# Patient Record
Sex: Female | Born: 1990 | Race: White | Hispanic: No | Smoking: Never smoker
Health system: Southern US, Community
[De-identification: ages and names within clinical notes are randomized; demographics above are authoritative.]

## PROBLEM LIST (undated history)

## (undated) DIAGNOSIS — R300 Dysuria: Secondary | ICD-10-CM

## (undated) DIAGNOSIS — K219 Gastro-esophageal reflux disease without esophagitis: Secondary | ICD-10-CM

## (undated) DIAGNOSIS — E538 Deficiency of other specified B group vitamins: Secondary | ICD-10-CM

## (undated) DIAGNOSIS — R3129 Other microscopic hematuria: Secondary | ICD-10-CM

## (undated) DIAGNOSIS — Z309 Encounter for contraceptive management, unspecified: Secondary | ICD-10-CM

## (undated) DIAGNOSIS — K311 Adult hypertrophic pyloric stenosis: Secondary | ICD-10-CM

## (undated) DIAGNOSIS — B029 Zoster without complications: Secondary | ICD-10-CM

## (undated) DIAGNOSIS — R7303 Prediabetes: Secondary | ICD-10-CM

## (undated) DIAGNOSIS — F418 Other specified anxiety disorders: Secondary | ICD-10-CM

## (undated) DIAGNOSIS — Z79899 Other long term (current) drug therapy: Secondary | ICD-10-CM

## (undated) DIAGNOSIS — E669 Obesity, unspecified: Secondary | ICD-10-CM

## (undated) DIAGNOSIS — R000 Tachycardia, unspecified: Secondary | ICD-10-CM

## (undated) DIAGNOSIS — J309 Allergic rhinitis, unspecified: Secondary | ICD-10-CM

## (undated) DIAGNOSIS — K5900 Constipation, unspecified: Secondary | ICD-10-CM

## (undated) DIAGNOSIS — F603 Borderline personality disorder: Secondary | ICD-10-CM

## (undated) DIAGNOSIS — M25561 Pain in right knee: Secondary | ICD-10-CM

## (undated) DIAGNOSIS — K589 Irritable bowel syndrome without diarrhea: Secondary | ICD-10-CM

## (undated) DIAGNOSIS — N390 Urinary tract infection, site not specified: Secondary | ICD-10-CM

## (undated) DIAGNOSIS — K2970 Gastritis, unspecified, without bleeding: Secondary | ICD-10-CM

## (undated) DIAGNOSIS — E039 Hypothyroidism, unspecified: Secondary | ICD-10-CM

## (undated) DIAGNOSIS — J45909 Unspecified asthma, uncomplicated: Secondary | ICD-10-CM

## (undated) DIAGNOSIS — R05 Cough: Secondary | ICD-10-CM

## (undated) DIAGNOSIS — F319 Bipolar disorder, unspecified: Secondary | ICD-10-CM

## (undated) DIAGNOSIS — R053 Chronic cough: Secondary | ICD-10-CM

## (undated) DIAGNOSIS — T148XXA Other injury of unspecified body region, initial encounter: Secondary | ICD-10-CM

## (undated) HISTORY — DX: Borderline personality disorder: F60.3

## (undated) HISTORY — DX: Allergic rhinitis, unspecified: J30.9

## (undated) HISTORY — DX: Other microscopic hematuria: R31.29

## (undated) HISTORY — DX: Constipation, unspecified: K59.00

## (undated) HISTORY — DX: Dysuria: R30.0

## (undated) HISTORY — DX: Chronic cough: R05.3

## (undated) HISTORY — DX: Other specified anxiety disorders: F41.8

## (undated) HISTORY — DX: Hypothyroidism, unspecified: E03.9

## (undated) HISTORY — DX: Urinary tract infection, site not specified: N39.0

## (undated) HISTORY — DX: Cough: R05

## (undated) HISTORY — DX: Pain in right knee: M25.561

## (undated) HISTORY — DX: Obesity, unspecified: E66.9

## (undated) HISTORY — DX: Irritable bowel syndrome without diarrhea: K58.9

## (undated) HISTORY — DX: Other long term (current) drug therapy: Z79.899

## (undated) HISTORY — DX: Deficiency of other specified B group vitamins: E53.8

## (undated) HISTORY — PX: OTHER SURGICAL HISTORY: SHX169

## (undated) HISTORY — DX: Gastro-esophageal reflux disease without esophagitis: K21.9

## (undated) HISTORY — DX: Zoster without complications: B02.9

## (undated) HISTORY — DX: Encounter for contraceptive management, unspecified: Z30.9

## (undated) HISTORY — DX: Tachycardia, unspecified: R00.0

## (undated) HISTORY — DX: Prediabetes: R73.03

## (undated) HISTORY — DX: Adult hypertrophic pyloric stenosis: K31.1

## (undated) HISTORY — DX: Gastritis, unspecified, without bleeding: K29.70

---

## 2009-06-11 HISTORY — PX: CHOLECYSTECTOMY: SHX55

## 2009-06-11 HISTORY — PX: LIVER BIOPSY: SHX301

## 2009-11-14 ENCOUNTER — Emergency Department: Payer: Self-pay | Admitting: Emergency Medicine

## 2009-12-23 ENCOUNTER — Inpatient Hospital Stay: Payer: Self-pay | Admitting: Psychiatry

## 2010-01-01 ENCOUNTER — Inpatient Hospital Stay: Payer: Self-pay | Admitting: Psychiatry

## 2010-01-08 ENCOUNTER — Emergency Department: Payer: Self-pay | Admitting: Emergency Medicine

## 2010-02-24 ENCOUNTER — Emergency Department: Payer: Self-pay | Admitting: Emergency Medicine

## 2011-07-05 ENCOUNTER — Emergency Department: Payer: Self-pay | Admitting: Emergency Medicine

## 2011-07-16 ENCOUNTER — Emergency Department: Payer: Self-pay | Admitting: Emergency Medicine

## 2012-06-19 ENCOUNTER — Emergency Department (HOSPITAL_COMMUNITY)
Admission: EM | Admit: 2012-06-19 | Discharge: 2012-06-19 | Disposition: A | Discharge status: Home / Self Care | Payer: Medicaid Other | Attending: Emergency Medicine | Admitting: Emergency Medicine

## 2012-06-19 ENCOUNTER — Encounter (HOSPITAL_COMMUNITY): Payer: Self-pay | Admitting: Emergency Medicine

## 2012-06-19 ENCOUNTER — Emergency Department (HOSPITAL_COMMUNITY): Payer: Medicaid Other

## 2012-06-19 DIAGNOSIS — F319 Bipolar disorder, unspecified: Secondary | ICD-10-CM | POA: Insufficient documentation

## 2012-06-19 DIAGNOSIS — Z9089 Acquired absence of other organs: Secondary | ICD-10-CM | POA: Insufficient documentation

## 2012-06-19 DIAGNOSIS — E079 Disorder of thyroid, unspecified: Secondary | ICD-10-CM | POA: Insufficient documentation

## 2012-06-19 DIAGNOSIS — Z711 Person with feared health complaint in whom no diagnosis is made: Secondary | ICD-10-CM

## 2012-06-19 DIAGNOSIS — R5381 Other malaise: Secondary | ICD-10-CM | POA: Insufficient documentation

## 2012-06-19 HISTORY — DX: Gastro-esophageal reflux disease without esophagitis: K21.9

## 2012-06-19 HISTORY — DX: Bipolar disorder, unspecified: F31.9

## 2012-06-19 LAB — URINE MICROSCOPIC-ADD ON

## 2012-06-19 LAB — CBC WITH DIFFERENTIAL/PLATELET
Eosinophils Relative: 2 % (ref 0–5)
HCT: 39.1 % (ref 36.0–46.0)
Hemoglobin: 13 g/dL (ref 12.0–15.0)
Lymphocytes Relative: 28 % (ref 12–46)
MCHC: 33.2 g/dL (ref 30.0–36.0)
MCV: 89.9 fL (ref 78.0–100.0)
Monocytes Absolute: 0.4 10*3/uL (ref 0.1–1.0)
Monocytes Relative: 5 % (ref 3–12)
Neutro Abs: 5.4 10*3/uL (ref 1.7–7.7)
WBC: 8.5 10*3/uL (ref 4.0–10.5)

## 2012-06-19 LAB — BASIC METABOLIC PANEL
BUN: 6 mg/dL (ref 6–23)
CO2: 27 mEq/L (ref 19–32)
Chloride: 102 mEq/L (ref 96–112)
Creatinine, Ser: 0.79 mg/dL (ref 0.50–1.10)

## 2012-06-19 LAB — URINALYSIS, ROUTINE W REFLEX MICROSCOPIC
Bilirubin Urine: NEGATIVE
Glucose, UA: NEGATIVE mg/dL
Hgb urine dipstick: NEGATIVE
Ketones, ur: NEGATIVE mg/dL
pH: 7.5 (ref 5.0–8.0)

## 2012-06-19 MED ORDER — PROMETHAZINE HCL 12.5 MG PO TABS
25.0000 mg | ORAL_TABLET | ORAL | Status: AC
  Administered 2012-06-19: 25 mg via ORAL
  Filled 2012-06-19: qty 2

## 2012-06-19 MED ORDER — IBUPROFEN 800 MG PO TABS
800.0000 mg | ORAL_TABLET | Freq: Once | ORAL | Status: AC
  Administered 2012-06-19: 800 mg via ORAL
  Filled 2012-06-19: qty 1

## 2012-06-19 MED ORDER — PROMETHAZINE HCL 25 MG PO TABS: 25.0000 mg | ORAL_TABLET | Freq: Four times a day (QID) | ORAL | Status: DC | PRN

## 2012-06-19 NOTE — ED Provider Notes (Signed)
History     CSN: [id number]  Arrival date & time 06/19/12  7425   First MD Initiated Contact with Patient 06/19/12 1011      Chief Complaint  Patient presents with  . Fatigue    (Consider location/radiation/quality/duration/timing/severity/associated sxs/prior treatment) HPI Comments: Feels fatigued, NP cough, urinary frequency and "itching" with urination and myalgias x 3 days.  LNMP ended 4 days ago.  No vaginal d/c.  No n/v/d.  No fever or chills.  No SOB.  The history is provided by the patient. No language interpreter was used.    Past Medical History  Diagnosis Date  . Personality disorder   . Bipolar 1 disorder   . Acid reflux   . Thyroid disease     Past Surgical History  Procedure Date  . Cholecystectomy     History reviewed. No pertinent family history.  History  Substance Use Topics  . Smoking status: Not on file  . Smokeless tobacco: Not on file  . Alcohol Use: No    OB History    Grav Para Term Preterm Abortions TAB SAB Ect Mult Living                  Review of Systems  Constitutional: Positive for fatigue. Negative for fever and chills.  Respiratory: Positive for cough. Negative for shortness of breath and wheezing.   Gastrointestinal: Negative for nausea, vomiting and diarrhea.  Genitourinary: Positive for dysuria and frequency. Negative for urgency, hematuria, vaginal bleeding and vaginal discharge.  Musculoskeletal: Positive for myalgias.  All other systems reviewed and are negative.    Allergies  Penicillins  Home Medications   Current Outpatient Rx  Name Route Sig Dispense Refill  . CLONAZEPAM 0.5 MG PO TABS Oral Take 0.5-1 mg by mouth 2 (two) times daily. Takes 0.5 mg (1 tab) in the morning and 2 tabs at bedtime    . FLUOXETINE HCL 40 MG PO CAPS Oral Take 40 mg by mouth daily.    Marland Kitchen LEVOTHYROXINE SODIUM 25 MCG PO TABS Oral Take 25 mcg by mouth daily.    Marland Kitchen LITHIUM CARBONATE 300 MG PO CAPS Oral Take 300-600 mg by mouth 2 (two)  times daily. Takes 1 capsule (300 mg) in the morning and takes 2 capsules (600mg ) at bedtime    . OMEPRAZOLE 20 MG PO CPDR Oral Take 20 mg by mouth daily.    Marland Kitchen HYDROCODONE-IBUPROFEN 7.5-200 MG PO TABS Oral Take 1 tablet by mouth every 8 (eight) hours as needed. pain    . PROMETHAZINE HCL 25 MG PO TABS Oral Take 25 mg by mouth every 6 (six) hours as needed. nausea    . PROMETHAZINE HCL 25 MG PO TABS Oral Take 1 tablet (25 mg total) by mouth every 6 (six) hours as needed for nausea. 30 tablet 0    BP 117/80  Pulse 85  Temp 98.2 F (36.8 C) (Oral)  Resp 18  Ht 5\' 6"  (1.676 m)  Wt 230 lb (104.327 kg)  BMI 37.12 kg/m2  SpO2 99%  LMP 06/15/2012  Physical Exam  Nursing note and vitals reviewed. Constitutional: She is oriented to person, place, and time. She appears well-developed and well-nourished. No distress.  HENT:  Head: Normocephalic and atraumatic.  Eyes: EOM are normal.  Neck: Normal range of motion.  Cardiovascular: Normal rate, regular rhythm and normal heart sounds.   Pulmonary/Chest: Effort normal and breath sounds normal. No accessory muscle usage. Not tachypneic. No respiratory distress. She has no decreased breath sounds.  She has no wheezes. She has no rhonchi. She has no rales. She exhibits no tenderness.  Abdominal: Soft. Normal appearance. She exhibits no distension. There is no hepatosplenomegaly. There is tenderness in the suprapubic area. There is CVA tenderness. There is no rigidity, no rebound, no guarding, no tenderness at McBurney's point and negative Murphy's sign.  Genitourinary: No vaginal discharge found.  Musculoskeletal: Normal range of motion.  Neurological: She is alert and oriented to person, place, and time.  Skin: Skin is warm and dry. No rash noted.  Psychiatric: She has a normal mood and affect. Judgment normal.    ED Course  Procedures (including critical care time)  Labs Reviewed  URINALYSIS, ROUTINE W REFLEX MICROSCOPIC - Abnormal; Notable for  the following:    Leukocytes, UA TRACE (*)     All other components within normal limits  URINE MICROSCOPIC-ADD ON - Abnormal; Notable for the following:    Squamous Epithelial / LPF MANY (*)     Bacteria, UA FEW (*)     All other components within normal limits  CBC WITH DIFFERENTIAL  BASIC METABOLIC PANEL  PREGNANCY, URINE   Dg Chest 2 View  06/19/2012  *RADIOLOGY REPORT*  Clinical Data: 21 year old female with cough, congestion and weakness.  CHEST - 2 VIEW  Comparison: None  Findings: The cardiomediastinal silhouette is unremarkable. The lungs are clear. There is no evidence of focal airspace disease, pulmonary edema, suspicious pulmonary nodule/mass, pleural effusion, or pneumothorax. No acute bony abnormalities are identified.  IMPRESSION: No evidence of active cardiopulmonary disease.   Original Report Authenticated By: Rosendo Gros, M.D.    At discharge time pt states she needs something for nausea.  Wrote rx for phenergan tabs.  She also does not appear to be happy when told she can take tylenol or ibuprofen for myalgias although did not specify what she would like.  1. Worried well       MDM  APAP/ibuprofen for myalgias.  rx-phenergan tabs, 12 F/u with PCP prn        Evalina Field, PA 06/19/12 1150

## 2012-06-19 NOTE — ED Notes (Signed)
Pt c/o weakness and pain all over x 3 days.

## 2012-06-19 NOTE — ED Provider Notes (Signed)
Medical screening examination/treatment/procedure(s) were performed by non-physician practitioner and as supervising physician I was immediately available for consultation/collaboration.  Raeford Razor, MD 06/19/12 1500

## 2012-06-21 ENCOUNTER — Emergency Department (HOSPITAL_COMMUNITY): Payer: Medicaid Other

## 2012-06-21 ENCOUNTER — Encounter (HOSPITAL_COMMUNITY): Payer: Self-pay | Admitting: Emergency Medicine

## 2012-06-21 ENCOUNTER — Emergency Department (HOSPITAL_COMMUNITY)
Admission: EM | Admit: 2012-06-21 | Discharge: 2012-06-21 | Disposition: A | Discharge status: Home / Self Care | Payer: Medicaid Other | Attending: Emergency Medicine | Admitting: Emergency Medicine

## 2012-06-21 DIAGNOSIS — R109 Unspecified abdominal pain: Secondary | ICD-10-CM | POA: Insufficient documentation

## 2012-06-21 DIAGNOSIS — E079 Disorder of thyroid, unspecified: Secondary | ICD-10-CM | POA: Insufficient documentation

## 2012-06-21 DIAGNOSIS — Z9089 Acquired absence of other organs: Secondary | ICD-10-CM | POA: Insufficient documentation

## 2012-06-21 DIAGNOSIS — F319 Bipolar disorder, unspecified: Secondary | ICD-10-CM | POA: Insufficient documentation

## 2012-06-21 DIAGNOSIS — R1110 Vomiting, unspecified: Secondary | ICD-10-CM | POA: Insufficient documentation

## 2012-06-21 LAB — COMPREHENSIVE METABOLIC PANEL
AST: 25 U/L (ref 0–37)
Albumin: 3.3 g/dL — ABNORMAL LOW (ref 3.5–5.2)
Calcium: 9.9 mg/dL (ref 8.4–10.5)
Chloride: 103 mEq/L (ref 96–112)
Creatinine, Ser: 0.93 mg/dL (ref 0.50–1.10)
Total Protein: 7.4 g/dL (ref 6.0–8.3)

## 2012-06-21 LAB — URINALYSIS, ROUTINE W REFLEX MICROSCOPIC
Hgb urine dipstick: NEGATIVE
Protein, ur: NEGATIVE mg/dL
Urobilinogen, UA: 0.2 mg/dL (ref 0.0–1.0)

## 2012-06-21 LAB — CBC WITH DIFFERENTIAL/PLATELET
Basophils Absolute: 0 10*3/uL (ref 0.0–0.1)
Basophils Relative: 0 % (ref 0–1)
Eosinophils Relative: 2 % (ref 0–5)
HCT: 41.5 % (ref 36.0–46.0)
MCHC: 33 g/dL (ref 30.0–36.0)
Monocytes Absolute: 0.7 10*3/uL (ref 0.1–1.0)
Neutro Abs: 6.6 10*3/uL (ref 1.7–7.7)
RDW: 12.8 % (ref 11.5–15.5)

## 2012-06-21 LAB — PREGNANCY, URINE: Preg Test, Ur: NEGATIVE

## 2012-06-21 MED ORDER — SODIUM CHLORIDE 0.9 % IV BOLUS (SEPSIS)
1000.0000 mL | Freq: Once | INTRAVENOUS | Status: AC
  Administered 2012-06-21: 1000 mL via INTRAVENOUS

## 2012-06-21 MED ORDER — IOHEXOL 300 MG/ML  SOLN
100.0000 mL | Freq: Once | INTRAMUSCULAR | Status: AC | PRN
  Administered 2012-06-21: 100 mL via INTRAVENOUS

## 2012-06-21 MED ORDER — PROMETHAZINE HCL 25 MG PO TABS: 25.0000 mg | ORAL_TABLET | Freq: Four times a day (QID) | ORAL | Status: DC | PRN

## 2012-06-21 MED ORDER — HYDROMORPHONE HCL PF 1 MG/ML IJ SOLN
1.0000 mg | Freq: Once | INTRAMUSCULAR | Status: AC
  Administered 2012-06-21: 1 mg via INTRAVENOUS
  Filled 2012-06-21: qty 1

## 2012-06-21 MED ORDER — OXYCODONE-ACETAMINOPHEN 5-325 MG PO TABS: 1.0000 | ORAL_TABLET | Freq: Four times a day (QID) | ORAL | Status: AC | PRN

## 2012-06-21 MED ORDER — ONDANSETRON HCL 4 MG/2ML IJ SOLN
4.0000 mg | Freq: Once | INTRAMUSCULAR | Status: AC
  Administered 2012-06-21: 4 mg via INTRAVENOUS
  Filled 2012-06-21: qty 2

## 2012-06-21 MED ORDER — HYDROMORPHONE HCL PF 1 MG/ML IJ SOLN
INTRAMUSCULAR | Status: AC
  Administered 2012-06-21: 1 mg
  Filled 2012-06-21: qty 1

## 2012-06-21 MED ORDER — OXYCODONE-ACETAMINOPHEN 5-325 MG PO TABS: 1.0000 | ORAL_TABLET | Freq: Four times a day (QID) | ORAL | Status: DC | PRN

## 2012-06-21 NOTE — ED Notes (Signed)
Patient with c/o diffuse abdominal pain with nausea x 5 days. Worse after eating per patient.

## 2012-06-21 NOTE — ED Provider Notes (Signed)
History  This chart was scribed for Tracy Lennert, MD by Erskine Emery. This patient was seen in room APA09/[facility]09 and the patient's care was started at 18:48.   CSN: [id number]  Arrival date & time 06/21/12  1537   First MD Initiated Contact with Patient 06/21/12 1848      Chief Complaint  Patient presents with  . Abdominal Pain  . Emesis    (Consider location/radiation/quality/duration/timing/severity/associated sxs/prior Treatment) Tracy Petersen is a 21 y.o. female who presents to the Emergency Department complaining of gradually worsening sharp, stabbing abdominal pain, worse on right side for the past 4 days. Pt reports the pain is unlike anything she has ever experienced before and is aggravated by eating. Pt reports she has had a lot of CT scans for various complaints but they always come out negative.  Pt saw a stomach specialist for a cholecystectomy over 2 years ago. Patient is a 21 y.o. female presenting with abdominal pain and vomiting. The history is provided by the patient. No language interpreter was used.  Abdominal Pain The primary symptoms of the illness include abdominal pain, nausea and vomiting. The primary symptoms of the illness do not include fever, fatigue or diarrhea. The current episode started more than 2 days ago. The onset of the illness was gradual. The problem has been gradually worsening.  The illness is associated with eating. The patient states that she believes she is currently not pregnant. The patient has not had a change in bowel habit. Symptoms associated with the illness do not include hematuria, frequency or back pain. Associated symptoms comments: nausea. Significant associated medical issues include GERD.  Emesis  Associated symptoms include abdominal pain. Pertinent negatives include no cough, no diarrhea, no fever and no headaches.    Past Medical History  Diagnosis Date  . Personality disorder   . Bipolar 1 disorder   . Acid reflux   .  Thyroid disease     Past Surgical History  Procedure Date  . Cholecystectomy     No family history on file.  History  Substance Use Topics  . Smoking status: Not on file  . Smokeless tobacco: Not on file  . Alcohol Use: No    OB History    Grav Para Term Preterm Abortions TAB SAB Ect Mult Living                  Review of Systems  Constitutional: Negative for fever and fatigue.  HENT: Negative for congestion, sinus pressure and ear discharge.   Eyes: Negative for discharge.  Respiratory: Negative for cough.   Cardiovascular: Negative for chest pain.  Gastrointestinal: Positive for nausea and abdominal pain. Negative for diarrhea.  Genitourinary: Negative for frequency and hematuria.  Musculoskeletal: Negative for back pain.  Skin: Negative for rash.  Neurological: Negative for seizures and headaches.  Hematological: Negative.   Psychiatric/Behavioral: Negative for hallucinations.  All other systems reviewed and are negative.    Allergies  Penicillins  Home Medications   Current Outpatient Rx  Name Route Sig Dispense Refill  . CLONAZEPAM 0.5 MG PO TABS Oral Take 0.5-1 mg by mouth 2 (two) times daily. Takes 0.5 mg (1 tab) in the morning and 2 tabs at bedtime    . FLUOXETINE HCL 40 MG PO CAPS Oral Take 40 mg by mouth daily.    Marland Kitchen LEVOTHYROXINE SODIUM 25 MCG PO TABS Oral Take 25 mcg by mouth daily.    Marland Kitchen LITHIUM CARBONATE 300 MG PO CAPS Oral Take 300-600  mg by mouth 2 (two) times daily. Takes 1 capsule (300 mg) in the morning and takes 2 capsules (600mg ) at bedtime    . OMEPRAZOLE 20 MG PO CPDR Oral Take 20 mg by mouth daily.    Marland Kitchen PROMETHAZINE HCL 25 MG PO TABS Oral Take 25 mg by mouth every 6 (six) hours as needed. nausea    . HYDROCODONE-IBUPROFEN 7.5-200 MG PO TABS Oral Take 1 tablet by mouth every 8 (eight) hours as needed. pain      Triage Vitals: BP 116/66  Pulse 117  Temp 98.7 F (37.1 C) (Oral)  Resp 20  Ht 5\' 6"  (1.676 m)  Wt 230 lb (104.327 kg)  BMI  37.12 kg/m2  SpO2 99%  LMP 06/15/2012  Physical Exam  Nursing note and vitals reviewed. Constitutional: She is oriented to person, place, and time. She appears well-developed.  HENT:  Head: Normocephalic and atraumatic.  Eyes: Conjunctivae and EOM are normal. No scleral icterus.  Neck: Neck supple. No thyromegaly present.  Cardiovascular: Normal rate and regular rhythm.  Exam reveals no gallop and no friction rub.   No murmur heard. Pulmonary/Chest: No stridor. She has no wheezes. She has no rales. She exhibits no tenderness.  Abdominal: She exhibits no distension. There is tenderness. There is no rebound.       Moderate RUQ and RLQ tenderness.  Musculoskeletal: Normal range of motion. She exhibits no edema.  Lymphadenopathy:    She has no cervical adenopathy.  Neurological: She is oriented to person, place, and time. Coordination normal.  Skin: No rash noted. No erythema.  Psychiatric: She has a normal mood and affect. Her behavior is normal.    ED Course  Procedures (including critical care time) DIAGNOSTIC STUDIES: Oxygen Saturation is 99% on room air, normal by my interpretation.    COORDINATION OF CARE: 18:55-I evaluated the patient and we discussed a treatment plan including urinalysis, pain medication, and radiology to which the pt agreed.   22:12--I rechecked the pt and notified her that all her labs and imaging look okay. Pt reports she doesn't have a PCP because she just moved here a week ago. Pt reports she has had abdominal pain but never this severe. I told her I would discharge her home with medication and instructed her to follow up with a GI specialist.    Labs Reviewed  URINALYSIS, ROUTINE W REFLEX MICROSCOPIC  PREGNANCY, URINE   No results found.   No diagnosis found.    MDM        The chart was scribed for me under my direct supervision.  I personally performed the history, physical, and medical decision making and all procedures in the  evaluation of this patient.Tracy Lennert, MD 06/21/12 2218

## 2012-06-21 NOTE — ED Notes (Addendum)
Pt reports she is unable to afford medications and wanting to be discharged home with full prescriptions.  Explained to pt that we were unable to do that.  Pt angry. Provided pt with list of [facility] and encouraged follow up with them for assistance.

## 2012-06-30 ENCOUNTER — Encounter: Payer: Self-pay | Admitting: Gastroenterology

## 2012-06-30 ENCOUNTER — Ambulatory Visit (INDEPENDENT_AMBULATORY_CARE_PROVIDER_SITE_OTHER): Payer: Medicaid Other | Admitting: Gastroenterology

## 2012-06-30 VITALS — BP 109/79 | HR 97 | Temp 97.6°F | Ht 66.0 in | Wt 248.8 lb

## 2012-06-30 DIAGNOSIS — R109 Unspecified abdominal pain: Secondary | ICD-10-CM | POA: Insufficient documentation

## 2012-06-30 DIAGNOSIS — R198 Other specified symptoms and signs involving the digestive system and abdomen: Secondary | ICD-10-CM | POA: Insufficient documentation

## 2012-06-30 MED ORDER — DICYCLOMINE HCL 10 MG PO CAPS: 10.0000 mg | ORAL_CAPSULE | Freq: Four times a day (QID) | ORAL | Status: DC

## 2012-06-30 NOTE — Assessment & Plan Note (Addendum)
21 year old with hx of significant psychiatric issues living in a group home with husband, who reports chronic abdominal pain that is constant, worsened by eating. No rectal bleeding noted. No wt loss, lack of appetite. Cholecystectomy performed several years ago. Thus far CT and labs overall benign. Also notes alternating constipation and diarrhea, unrelated to abdominal pain. Physical exam quite interesting with pt grimacing and jumping prior to me even palpating her abdomen. +Carnett's sign. I question a strong component of functional abdominal pain/adhesive disease with possible underlying undiagnosed IBS. She has not had an upper or lower GI evaluation, although she states she has. There is no indication to proceed with colonoscopy or upper endoscopy at this point. We will try supportive measures for IBS first.   Bentyl Miralax High fiber diet Probiotic Return in 4 weeks Consider Pain Management referral. Possibly consider EGD if symptoms seem to point more towards dyspepsia at next visit. Pt does note worsened with eating, but her pain is constant and I doubt we are dealing with gastritis, PUD, etc.

## 2012-06-30 NOTE — Progress Notes (Signed)
Faxed to PCP

## 2012-06-30 NOTE — Patient Instructions (Addendum)
Follow a high fiber diet. Avoid fatty and greasy foods.   Start taking a probiotic daily. This can be over-the-counter such as Digestive Advantage, [employer]'s [employer] Health.   For constipation: You may take one dose of Miralax daily as needed. Stop if you have loose stools.  For loose stools: Bentyl 10 mg before meals and at bedtime (no more than 4 a day). Monitor for constipation, dry mouth.   We will see you back in 4 weeks to see how you are doing.    High Fiber Diet A high fiber diet changes your normal diet to include more whole grains, legumes, fruits, and vegetables. Changes in the diet involve replacing refined carbohydrates with unrefined foods. The calorie level of the diet is essentially unchanged. The Dietary Reference Intake (recommended amount) for adult males is 38 g per day. For adult females, it is 25 g per day. Pregnant and lactating women should consume 28 g of fiber per day. Fiber is the intact part of a plant that is not broken down during digestion. Functional fiber is fiber that has been isolated from the plant to provide a beneficial effect in the body. PURPOSE  Increase stool bulk.   Ease and regulate bowel movements.   Lower cholesterol.  INDICATIONS THAT YOU NEED MORE FIBER  Constipation and hemorrhoids.   Uncomplicated diverticulosis (intestine condition) and irritable bowel syndrome.   Weight management.   As a protective measure against hardening of the arteries (atherosclerosis), diabetes, and cancer.  NOTE OF CAUTION If you have a digestive or bowel problem, ask your caregiver for advice before adding high fiber foods to your diet. Some of the following medical problems are such that a high fiber diet should not be used without consulting your caregiver:  Acute diverticulitis (intestine infection).   Partial small bowel obstructions.   Complicated diverticular disease involving bleeding, rupture (perforation), or abscess (boil, furuncle).    Presence of autonomic neuropathy (nerve damage) or gastric paresis (stomach cannot empty itself).  GUIDELINES FOR INCREASING FIBER  Start adding fiber to the diet slowly. A gradual increase of about 5 more grams (2 slices of whole-wheat bread, 2 servings of most fruits or vegetables, or 1 bowl of high fiber cereal) per day is best. Too rapid an increase in fiber may result in constipation, flatulence, and bloating.   Drink enough water and fluids to keep your urine clear or pale yellow. Water, juice, or caffeine-free drinks are recommended. Not drinking enough fluid may cause constipation.   Eat a variety of high fiber foods rather than one type of fiber.   Try to increase your intake of fiber through using high fiber foods rather than fiber pills or supplements that contain small amounts of fiber.   The goal is to change the types of food eaten. Do not supplement your present diet with high fiber foods, but replace foods in your present diet.  INCLUDE A VARIETY OF FIBER SOURCES  Replace refined and processed grains with whole grains, canned fruits with fresh fruits, and incorporate other fiber sources. White rice, white breads, and most bakery goods contain little or no fiber.   Brown whole-grain rice, buckwheat oats, and many fruits and vegetables are all good sources of fiber. These include: broccoli, Brussels sprouts, cabbage, cauliflower, beets, sweet potatoes, white potatoes (skin on), carrots, tomatoes, eggplant, squash, berries, fresh fruits, and dried fruits.   Cereals appear to be the richest source of fiber. Cereal fiber is found in whole grains and bran. Bran is  the fiber-rich outer coat of cereal grain, which is largely removed in refining. In whole-grain cereals, the bran remains. In breakfast cereals, the largest amount of fiber is found in those with "bran" in their names. The fiber content is sometimes indicated on the label.   You may need to include additional fruits and  vegetables each day.   In baking, for 1 cup white flour, you may use the following substitutions:   1 cup whole-wheat flour minus 2 tbs.    cup white flour plus  cup whole-wheat flour.  Document Released: 10/12/2005 Document Revised: 10/01/2011 Document Reviewed: 08/20/2009 [facility] Patient Information 2012 [place], [place].

## 2012-06-30 NOTE — Assessment & Plan Note (Signed)
See Abdominal pain

## 2012-06-30 NOTE — Progress Notes (Signed)
Primary Care Physician:  Colon Branch, MD Primary Gastroenterologist:  Dr. Darrick Penna   Chief Complaint  Patient presents with  . Abdominal Pain  . Nausea  . Constipation  . Diarrhea  . Obesity    HPI:   21 year old female who lives in a group home with her husband, whom she met in a previous group home, presents today with abdominal pain. Several visits to ED noted. Normal CBC, no LFT abnormality, normal lipase, CT overall benign. New resident at [facility] since Aug 19th. Used to live in [place]. Notes right-sided abdominal pain, diffuse abdominal pain, +nausea, headaches that feel like getting stabbed. +fatigue. Notes right upper back pain as well. Onset of pain for several weeks, described as sharp/stabbing/crampy. Always there. Eating/drinking makes pain worse, regardless of what it is. Pain medicine only thing that helps to alleviate. No back injuries. Alternating between constipation and diarrhea. Notes now dealing with constipation. Will have postprandial loose stools for 2 days, then starts having constipation for 2-3 days. Loose stools usually total twice per day.  Has experienced episodes similar, but this episode here is the worst. Before in [place], was married and staying in [place], saw a GI doctor there. [facility] in [place]. Dr. Herschel [place]. Was at least 2 years ago. Underwent cholecystectomy. Was told "something was out of place". Was told would be experiencing pain. Has happened more than 5 times. Pt states she had an upper endoscopy and colonoscopy; however, no record of this exists at that facility. No rectal bleeding, melena. No routine NSAIDs.     CT abd/pelvis Aug 2013 : Findings: Limited images through the lung bases demonstrate no  significant appreciable abnormality. The heart size is within  normal limits. No pleural or pericardial effusion.  Hepatic steatosis. Cholecystectomy. No biliary ductal dilatation.  Unremarkable spleen, pancreas, adrenal  glands.  Symmetric renal enhancement. No hydronephrosis or hydroureter.  No bowel obstruction. No CT evidence for colitis. Normal  appendix. No free intraperitoneal air or fluid. No  lymphadenopathy.  Normal caliber vasculature. Circumaortic left renal vein.  Circumferential bladder wall thickening is nonspecific given  incomplete distension. Uterus and adnexa within normal limits for  CT. No free fluid within the pelvis. No pelvic adenopathy.  No acute osseous finding. Small fat containing supraumbilical  hernia.  IMPRESSION:  Hepatic steatosis.  No acute intra-abdominal process identified.  Past Medical History  Diagnosis Date  . Personality disorder   . Bipolar 1 disorder   . Acid reflux   . Hypothyroidism   . Depression with anxiety     Past Surgical History  Procedure Date  . Cholecystectomy   . Surgery as a baby     unsure what it was    Current Outpatient Prescriptions  Medication Sig Dispense Refill  . clonazePAM (KLONOPIN) 0.5 MG tablet Take 0.5-1 mg by mouth 2 (two) times daily. Takes 0.5 mg (1 tab) in the morning and 2 tabs at bedtime      . FLUoxetine (PROZAC) 40 MG capsule Take 40 mg by mouth daily.      Marland Kitchen HYDROcodone-ibuprofen (VICOPROFEN) 7.5-200 MG per tablet Take 1 tablet by mouth every 8 (eight) hours as needed. pain      . levothyroxine (SYNTHROID, LEVOTHROID) 25 MCG tablet Take 25 mcg by mouth daily.      Marland Kitchen lithium carbonate 300 MG capsule Take 300-600 mg by mouth 2 (two) times daily. Takes 1 capsule (300 mg) in the morning and takes 2 capsules (600mg ) at bedtime      .  omeprazole (PRILOSEC) 20 MG capsule Take 20 mg by mouth daily.      . promethazine (PHENERGAN) 25 MG tablet Take 25 mg by mouth every 6 (six) hours as needed. nausea      . dicyclomine (BENTYL) 10 MG capsule Take 1 capsule (10 mg total) by mouth 4 (four) times daily.  120 capsule  1  . oxyCODONE-acetaminophen (PERCOCET/ROXICET) 5-325 MG per tablet Take 1 tablet by mouth every 6 (six) hours  as needed for pain.  30 tablet  0  . promethazine (PHENERGAN) 25 MG tablet Take 1 tablet (25 mg total) by mouth every 6 (six) hours as needed for nausea.  15 tablet  0    Allergies as of 06/30/2012 - Review Complete 06/30/2012  Allergen Reaction Noted  . Penicillins  06/19/2012    Family History  Problem Relation Age of Onset  . Colon cancer Neg Hx     History   Social History  . Marital Status: Married    Spouse Name: N/A    Number of Children: N/A  . Years of Education: N/A   Occupational History  . Not on file.   Social History Main Topics  . Smoking status: Never Smoker   . Smokeless tobacco: Not on file  . Alcohol Use: Yes  . Drug Use: No  . Sexually Active: Not on file   Other Topics Concern  . Not on file   Social History Narrative  . No narrative on file    Review of Systems: Gen: Denies any fever, chills, fatigue, weight loss, lack of appetite.  CV: Denies chest pain, heart palpitations, peripheral edema, syncope.  Resp: Denies shortness of breath at rest or with exertion. Denies wheezing or cough.  GI: Denies dysphagia or odynophagia. Denies jaundice, hematemesis, fecal incontinence. GU : Denies urinary burning, urinary frequency, urinary hesitancy MS: Denies joint pain, muscle weakness, cramps, or limitation of movement.  Derm: Denies rash, itching, dry skin Psych: + depression/anxiety Heme: Denies bruising, bleeding, and enlarged lymph nodes.  Physical Exam: BP 109/79  Pulse 97  Temp 97.6 F (36.4 C) (Temporal)  Ht 5\' 6"  (1.676 m)  Wt 248 lb 12.8 oz (112.855 kg)  BMI 40.16 kg/m2  LMP 06/15/2012 General:   Alert and oriented. Well-nourished and well-developed. Flat affect. Slow to respond.  Head:  Normocephalic and atraumatic. Eyes:  Without icterus, sclera clear and conjunctiva pink.  Ears:  Normal auditory acuity. Nose:  No deformity, discharge,  or lesions. Mouth:  No deformity or lesions, oral mucosa pink.  Neck:  Supple, without mass or  thyromegaly. Lungs:  Clear to auscultation bilaterally. No wheezes, rales, or rhonchi. No distress.  Heart:  S1, S2 present without murmurs appreciated.  Abdomen: Diffusely tender to palpation, out of proportion compared to palpation. Barely touching abdomen causes her to grimace, flinch.  +BS, soft, No HSM noted. Midline scar noted. +CARNETT'S SIGN Rectal:  Deferred  Msk:  Symmetrical without gross deformities. Normal posture. Extremities:  Without clubbing or edema. Neurologic:  Alert and  oriented x4;  grossly normal neurologically. Skin:  Intact without significant lesions or rashes. Cervical Nodes:  No significant cervical adenopathy. Psych:  Alert and cooperative. Flat affect.

## 2012-07-24 ENCOUNTER — Encounter (HOSPITAL_COMMUNITY): Payer: Self-pay | Admitting: *Deleted

## 2012-07-24 ENCOUNTER — Emergency Department (HOSPITAL_COMMUNITY)
Admission: EM | Admit: 2012-07-24 | Discharge: 2012-07-24 | Disposition: A | Discharge status: Home / Self Care | Payer: Medicaid Other | Attending: Emergency Medicine | Admitting: Emergency Medicine

## 2012-07-24 DIAGNOSIS — Z7289 Other problems related to lifestyle: Secondary | ICD-10-CM

## 2012-07-24 DIAGNOSIS — Z880 Allergy status to penicillin: Secondary | ICD-10-CM | POA: Insufficient documentation

## 2012-07-24 DIAGNOSIS — F489 Nonpsychotic mental disorder, unspecified: Secondary | ICD-10-CM | POA: Insufficient documentation

## 2012-07-24 DIAGNOSIS — F319 Bipolar disorder, unspecified: Secondary | ICD-10-CM | POA: Insufficient documentation

## 2012-07-24 DIAGNOSIS — E039 Hypothyroidism, unspecified: Secondary | ICD-10-CM | POA: Insufficient documentation

## 2012-07-24 DIAGNOSIS — F609 Personality disorder, unspecified: Secondary | ICD-10-CM | POA: Insufficient documentation

## 2012-07-24 DIAGNOSIS — K219 Gastro-esophageal reflux disease without esophagitis: Secondary | ICD-10-CM | POA: Insufficient documentation

## 2012-07-24 DIAGNOSIS — Z9089 Acquired absence of other organs: Secondary | ICD-10-CM | POA: Insufficient documentation

## 2012-07-24 NOTE — ED Notes (Signed)
[facility] notified of pts upcoming dishcarge. They are to send someone to pick pt up.

## 2012-07-24 NOTE — ED Notes (Signed)
Pt wanded by security. 

## 2012-07-24 NOTE — ED Notes (Signed)
MD at bedside speaking with patient. Pt tearful because she had to remove piercing's.

## 2012-07-24 NOTE — ED Notes (Signed)
Pt is from [facility]. Pt states she was cutting herself with a razor "a day or so ago"  Minor Abrasions to left upper arm and left wrist area. Pt signed safety contract and husband locked all sharp objects up. Not sent my nursing facility states, "She seem very suicidal and aggressive plus refused her meds this morning" Pt is calm and cooperative. Denies suicidal ideation and states history of "cutting"

## 2012-07-24 NOTE — ED Provider Notes (Signed)
History   This chart was scribed for Raeford Razor, MD by Charolett Bumpers . The patient was seen in room APAH8/APAH8. Patient's care was started at 1056.    CSN: [id number]  Arrival date & time 07/24/12  1046   First MD Initiated Contact with Patient 07/24/12 1056      Chief Complaint  Patient presents with  . V70.1    (Consider location/radiation/quality/duration/timing/severity/associated sxs/prior treatment) HPI Tracy Petersen is a 21 y.o. female who presents to the Emergency Department for medical clearance. Pt states that she has a h/o cutting that she uses for release and coping. Pt states that she cut her left wrist and left upper arm with a razor a couple of days ago. Pt states that she has had recent stressors but states that she doesn't feel comfortable discussing. Pt denies any SI or HI. She denies any recent changes in her medications. She denies any hallucinations. She states she feel safe where she is staying and feels safe to be alone. She states that she signed a [employer] manufacturing systems and her husband locked up all sharp objects. Pt states that her husband wanted her to be seen in ED for evaluation. Pt stays at [facility] rest home. She states her Tetanus is UTD.   Past Medical History  Diagnosis Date  . Personality disorder   . Bipolar 1 disorder   . Acid reflux   . Hypothyroidism   . Depression with anxiety     Past Surgical History  Procedure Date  . Cholecystectomy   . Surgery as a baby     unsure what it was    Family History  Problem Relation Age of Onset  . Colon cancer Neg Hx     History  Substance Use Topics  . Smoking status: Never Smoker   . Smokeless tobacco: Not on file  . Alcohol Use: Yes    OB History    Grav Para Term Preterm Abortions TAB SAB Ect Mult Living                  Review of Systems  Psychiatric/Behavioral: Positive for self-injury. Negative for suicidal ideas and hallucinations.  All other systems reviewed and are  negative.    Allergies  Penicillins  Home Medications   Current Outpatient Rx  Name Route Sig Dispense Refill  . CLONAZEPAM 0.5 MG PO TABS Oral Take 0.5-1 mg by mouth 2 (two) times daily. Takes 0.5 mg (1 tab) in the morning and 2 tabs at bedtime    . DICYCLOMINE HCL 10 MG PO CAPS Oral Take 1 capsule (10 mg total) by mouth 4 (four) times daily. 120 capsule 1  . FLUOXETINE HCL 40 MG PO CAPS Oral Take 40 mg by mouth daily.    Marland Kitchen HYDROCODONE-IBUPROFEN 7.5-200 MG PO TABS Oral Take 1 tablet by mouth every 8 (eight) hours as needed. pain    . LEVOTHYROXINE SODIUM 25 MCG PO TABS Oral Take 25 mcg by mouth daily.    Marland Kitchen LITHIUM CARBONATE 300 MG PO CAPS Oral Take 300-600 mg by mouth 2 (two) times daily. Takes 1 capsule (300 mg) in the morning and takes 2 capsules (600mg ) at bedtime    . OMEPRAZOLE 20 MG PO CPDR Oral Take 20 mg by mouth daily.    Marland Kitchen PROMETHAZINE HCL 25 MG PO TABS Oral Take 25 mg by mouth every 6 (six) hours as needed. nausea    . PROMETHAZINE HCL 25 MG PO TABS Oral Take 1 tablet (25 mg  total) by mouth every 6 (six) hours as needed for nausea. 15 tablet 0    BP 135/98  Pulse 132  Temp 98.5 F (36.9 C) (Oral)  Resp 18  SpO2 99%  LMP 06/16/2012  Physical Exam  Nursing note and vitals reviewed. Constitutional: She is oriented to person, place, and time. She appears well-developed and well-nourished. No distress.       Pt is crying and upset but no acute distress.   HENT:  Head: Normocephalic and atraumatic.  Eyes: Conjunctivae normal and EOM are normal. Pupils are equal, round, and reactive to light.  Neck: Normal range of motion. Neck supple.  Cardiovascular: Normal rate, regular rhythm and normal heart sounds.   Pulmonary/Chest: Effort normal and breath sounds normal.  Abdominal: Soft. Bowel sounds are normal.  Musculoskeletal: Normal range of motion.  Neurological: She is alert and oriented to person, place, and time.  Skin: Skin is warm and dry.       Superficial  abrasions to the volar aspect of the left wrist and left upper arm. No active bleeding.   Psychiatric: She has a normal mood and affect.    ED Course  Procedures (including critical care time)  DIAGNOSTIC STUDIES: Oxygen Saturation is 99% on room air, normal by my interpretation.    COORDINATION OF CARE:  11:30-Discussed planned course of treatment with the patient including d/c, who is agreeable at this time.     Labs Reviewed - No data to display No results found.   1. Deliberate self-cutting       MDM  21yF with cutting behavior. Hx of same. Says does as mechanism to cope with stress. Denies SI. No HI. Superficial abrasions on exam. No evidence of psychosis or cognitive impairment. NO basis to IVC. Pt would like to leave. Offered to have ACT speak with her, but she is declining.    I personally preformed the services scribed in my presence. The recorded information has been reviewed and considered. Raeford Razor, MD.       Raeford Razor, MD 07/28/12 1038

## 2012-07-24 NOTE — ED Notes (Signed)
Pt states her period is late and she is hoping to have a baby in the near future.

## 2012-07-24 NOTE — ED Notes (Signed)
CORRECTION: PT is from [facility]. Not [facility].

## 2012-07-27 ENCOUNTER — Other Ambulatory Visit: Payer: Self-pay

## 2012-07-27 MED ORDER — DICYCLOMINE HCL 10 MG PO CAPS: 10.0000 mg | ORAL_CAPSULE | Freq: Four times a day (QID) | ORAL | Status: DC

## 2012-07-28 ENCOUNTER — Ambulatory Visit (INDEPENDENT_AMBULATORY_CARE_PROVIDER_SITE_OTHER): Payer: Medicaid Other | Admitting: Gastroenterology

## 2012-07-28 ENCOUNTER — Encounter: Payer: Self-pay | Admitting: Gastroenterology

## 2012-07-28 VITALS — BP 142/87 | HR 117 | Temp 97.2°F | Ht 66.0 in | Wt 247.4 lb

## 2012-07-28 DIAGNOSIS — R109 Unspecified abdominal pain: Secondary | ICD-10-CM

## 2012-07-28 DIAGNOSIS — K5900 Constipation, unspecified: Secondary | ICD-10-CM

## 2012-07-28 MED ORDER — LUBIPROSTONE 24 MCG PO CAPS: 24.0000 ug | ORAL_CAPSULE | Freq: Two times a day (BID) | ORAL | Status: DC

## 2012-07-28 NOTE — Patient Instructions (Addendum)
Start taking Amitiza once a day WITH FOOD for 3 days, then increase to twice a day WITH FOOD. Monitor for diarrhea.   If worsening nausea, any signs of rectal bleeding, worsening abdominal pain, call our office.  Otherwise, we will see you back in 3 months.

## 2012-07-28 NOTE — Progress Notes (Signed)
Referring Provider: Colon Branch, MD Primary Care Physician:  Ernestine Conrad, MD Primary Gastroenterologist: Dr. Darrick Penna  Chief Complaint  Patient presents with  . Follow-up  . Constipation  . Abdominal Pain  . Nausea    HPI:   21 year old female presenting today for f/u of constipation, diarrhea, abdominal pain. Lives in group home with husband. Significant psych hx as below. Recently went to ED for "cutting" behaviors. No suicidal ideation or homicidal ideation. Notes diarrhea has gotten better. Constipation is the biggest problem currently. Taking Miralax daily now. Not completely normal. States almost out of nausea medicine. Nausea and abdominal pain continue. Nausea constant, sometimes worse with eating. Depends on what it is she is eating. Happens mostly with veggies, "good things I should eat". Majority of abdominal pain on right-side, "moves around to front and to the left", really sharp and cramping. Worse with eating. Notes as RLQ, lower abdomen, LLQ. Sometimes will have pain in chest but rare. Having more back and neck pain than usual. Sometimes back pain is so bad doesn't feel like getting up. Oxycodone makes her feel weird. Was prescribed for belly and back pain. Given by ED.   Absolutely no bright red blood per rectum, no tarry stool.   Bentyl made symptoms worse, made stomach feel "sick". Made her feel really weird.   Recently got hair cut, short. Pt appears to be in much better spirits than the first time I met her. Less lag time in responding to my questions and appears engaged in the conversation.   Past Medical History  Diagnosis Date  . Personality disorder   . Bipolar 1 disorder   . Acid reflux   . Hypothyroidism   . Depression with anxiety     Past Surgical History  Procedure Date  . Cholecystectomy   . Surgery as a baby     unsure what it was    Current Outpatient Prescriptions  Medication Sig Dispense Refill  . clonazePAM (KLONOPIN) 0.5 MG tablet Take  0.5-1 mg by mouth 2 (two) times daily. Takes 0.5 mg (1 tab) in the morning and 2 tabs at bedtime      . FLUoxetine (PROZAC) 40 MG capsule Take 40 mg by mouth daily.      Marland Kitchen HYDROcodone-ibuprofen (VICOPROFEN) 7.5-200 MG per tablet Take 1 tablet by mouth every 8 (eight) hours as needed. pain      . levothyroxine (SYNTHROID, LEVOTHROID) 25 MCG tablet Take 25 mcg by mouth daily.      Marland Kitchen lithium carbonate 300 MG capsule Take 300-600 mg by mouth 2 (two) times daily. Takes 1 capsule (300 mg) in the morning and takes 2 capsules (600mg ) at bedtime      . Norgestim-Eth Estrad Triphasic (TRINESSA, 28, PO) Take 1 tablet by mouth daily.      Marland Kitchen omeprazole (PRILOSEC) 20 MG capsule Take 20 mg by mouth daily.      . Probiotic Product (ALIGN PO) Take 1 capsule by mouth daily.      . promethazine (PHENERGAN) 25 MG tablet Take 25 mg by mouth every 6 (six) hours as needed. nausea      . dicyclomine (BENTYL) 10 MG capsule Take 1 capsule (10 mg total) by mouth 4 (four) times daily.  120 capsule  3    Allergies as of 07/28/2012 - Review Complete 07/28/2012  Allergen Reaction Noted  . Penicillins  06/19/2012    Family History  Problem Relation Age of Onset  . Colon cancer Neg Hx  History   Social History  . Marital Status: Married    Spouse Name: N/A    Number of Children: N/A  . Years of Education: N/A   Social History Main Topics  . Smoking status: Never Smoker   . Smokeless tobacco: None  . Alcohol Use: Yes  . Drug Use: No  . Sexually Active: None   Other Topics Concern  . None   Social History Narrative  . None    Review of Systems: Gen: Denies fever, chills, anorexia. Denies fatigue, weakness, weight loss.  CV: Denies chest pain, palpitations, syncope, peripheral edema, and claudication. Resp: Denies dyspnea at rest, cough, wheezing, coughing up blood, and pleurisy. GI: Denies vomiting blood, jaundice, and fecal incontinence.   Denies dysphagia or odynophagia. Derm: Denies rash,  itching, dry skin Psych:  No homicidal or suicidal ideation.  Heme: Denies bruising, bleeding, and enlarged lymph nodes.  Physical Exam: BP 142/87  Pulse 117  Temp 97.2 F (36.2 C) (Temporal)  Ht 5\' 6"  (1.676 m)  Wt 247 lb 6.4 oz (112.22 kg)  BMI 39.93 kg/m2  LMP 07/17/2012 General:   Alert and oriented. No distress noted. Appears more personable today. Less vacant look, more engaged.  Head:  Normocephalic and atraumatic. Eyes:  Conjuctiva clear without scleral icterus. Mouth:  Oral mucosa pink and moist. Good dentition. No lesions. Heart:  S1, S2 present without murmurs, rubs, or gallops. Regular rate and rhythm. Abdomen:  +BS, soft, TTP diffusely, out of proportion compared to palpation. Slight touch causes grimacing and flinching. Carnett's sign not assessed this visit, but it was POSITIVE last visit. Overall similar presentation today.   Msk:  Symmetrical without gross deformities. Normal posture. Extremities:  Without edema. Neurologic:  Alert and  oriented x4;  grossly normal neurologically. Skin:  Intact without significant lesions or rashes. Psych:  Alert and cooperative.

## 2012-07-31 DIAGNOSIS — K5900 Constipation, unspecified: Secondary | ICD-10-CM | POA: Insufficient documentation

## 2012-07-31 NOTE — Assessment & Plan Note (Signed)
21 year old with psychiatric issues as listed above but appears overall in better mood today, noting chronic abdominal pain. No warning signs such as rectal bleeding, wt loss, lack of appetite. CT and labs overall benign. Diarrhea has resolved, now notes constipation. Physical exam last visit + Carnett's sign. 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 No need for lower GI tract evaluation unless rectal bleeding. Will stop Miralax and start Amitiza 24 mcg BID. Continue high fiber diet. Return in 3 mos or sooner if needed.

## 2012-07-31 NOTE — Assessment & Plan Note (Signed)
Amitiza 24 mcg BID  

## 2012-08-01 NOTE — Progress Notes (Signed)
Faxed to PCP

## 2012-08-02 ENCOUNTER — Telehealth: Payer: Self-pay | Admitting: Gastroenterology

## 2012-08-02 NOTE — Telephone Encounter (Signed)
Darrall Dears from the group home that patient is in called to let us know that the medication that we put her on is not working and could we change it to  Something else. Please advise and call LaToya at [phone]

## 2012-08-03 ENCOUNTER — Encounter: Payer: Self-pay | Admitting: Gastroenterology

## 2012-08-03 NOTE — Telephone Encounter (Signed)
May pick up Linzess samples 290 mcg, take daily. Call with PR in 1 week.

## 2012-08-03 NOTE — Telephone Encounter (Signed)
Tracy Petersen will be coming to pick up samples

## 2012-08-05 ENCOUNTER — Encounter (HOSPITAL_COMMUNITY): Payer: Self-pay

## 2012-08-05 ENCOUNTER — Emergency Department (HOSPITAL_COMMUNITY)
Admission: EM | Admit: 2012-08-05 | Discharge: 2012-08-05 | Payer: Medicaid Other | Attending: Emergency Medicine | Admitting: Emergency Medicine

## 2012-08-05 ENCOUNTER — Emergency Department (HOSPITAL_COMMUNITY): Payer: Medicaid Other

## 2012-08-05 DIAGNOSIS — R609 Edema, unspecified: Secondary | ICD-10-CM | POA: Insufficient documentation

## 2012-08-05 DIAGNOSIS — M542 Cervicalgia: Secondary | ICD-10-CM | POA: Insufficient documentation

## 2012-08-05 DIAGNOSIS — K219 Gastro-esophageal reflux disease without esophagitis: Secondary | ICD-10-CM | POA: Insufficient documentation

## 2012-08-05 DIAGNOSIS — R05 Cough: Secondary | ICD-10-CM | POA: Insufficient documentation

## 2012-08-05 DIAGNOSIS — Z79899 Other long term (current) drug therapy: Secondary | ICD-10-CM | POA: Insufficient documentation

## 2012-08-05 DIAGNOSIS — R0781 Pleurodynia: Secondary | ICD-10-CM

## 2012-08-05 DIAGNOSIS — R0602 Shortness of breath: Secondary | ICD-10-CM | POA: Insufficient documentation

## 2012-08-05 DIAGNOSIS — E669 Obesity, unspecified: Secondary | ICD-10-CM | POA: Insufficient documentation

## 2012-08-05 DIAGNOSIS — F319 Bipolar disorder, unspecified: Secondary | ICD-10-CM | POA: Insufficient documentation

## 2012-08-05 DIAGNOSIS — R071 Chest pain on breathing: Secondary | ICD-10-CM | POA: Insufficient documentation

## 2012-08-05 DIAGNOSIS — R109 Unspecified abdominal pain: Secondary | ICD-10-CM | POA: Insufficient documentation

## 2012-08-05 DIAGNOSIS — R61 Generalized hyperhidrosis: Secondary | ICD-10-CM | POA: Insufficient documentation

## 2012-08-05 DIAGNOSIS — R059 Cough, unspecified: Secondary | ICD-10-CM | POA: Insufficient documentation

## 2012-08-05 DIAGNOSIS — F341 Dysthymic disorder: Secondary | ICD-10-CM | POA: Insufficient documentation

## 2012-08-05 DIAGNOSIS — M549 Dorsalgia, unspecified: Secondary | ICD-10-CM | POA: Insufficient documentation

## 2012-08-05 DIAGNOSIS — E039 Hypothyroidism, unspecified: Secondary | ICD-10-CM | POA: Insufficient documentation

## 2012-08-05 LAB — COMPREHENSIVE METABOLIC PANEL
AST: 16 U/L (ref 0–37)
Albumin: 3 g/dL — ABNORMAL LOW (ref 3.5–5.2)
Calcium: 9.9 mg/dL (ref 8.4–10.5)
Chloride: 105 mEq/L (ref 96–112)
Creatinine, Ser: 0.88 mg/dL (ref 0.50–1.10)
Total Bilirubin: 0.1 mg/dL — ABNORMAL LOW (ref 0.3–1.2)

## 2012-08-05 LAB — CBC WITH DIFFERENTIAL/PLATELET
Basophils Absolute: 0 10*3/uL (ref 0.0–0.1)
Eosinophils Relative: 3 % (ref 0–5)
Lymphocytes Relative: 27 % (ref 12–46)
MCV: 89.1 fL (ref 78.0–100.0)
Neutro Abs: 6.8 10*3/uL (ref 1.7–7.7)
Platelets: 271 10*3/uL (ref 150–400)
RDW: 13 % (ref 11.5–15.5)
WBC: 10.5 10*3/uL (ref 4.0–10.5)

## 2012-08-05 LAB — D-DIMER, QUANTITATIVE: D-Dimer, Quant: 0.68 ug/mL-FEU — ABNORMAL HIGH (ref 0.00–0.48)

## 2012-08-05 MED ORDER — KETOROLAC TROMETHAMINE 30 MG/ML IJ SOLN
30.0000 mg | Freq: Once | INTRAMUSCULAR | Status: AC
  Administered 2012-08-05: 30 mg via INTRAVENOUS
  Filled 2012-08-05: qty 1

## 2012-08-05 MED ORDER — TRAMADOL HCL 50 MG PO TABS: 50.0000 mg | ORAL_TABLET | Freq: Four times a day (QID) | ORAL | Status: DC | PRN

## 2012-08-05 MED ORDER — IOHEXOL 350 MG/ML SOLN
100.0000 mL | Freq: Once | INTRAVENOUS | Status: AC | PRN
  Administered 2012-08-05: 100 mL via INTRAVENOUS

## 2012-08-05 MED ORDER — MORPHINE SULFATE 4 MG/ML IJ SOLN
4.0000 mg | Freq: Once | INTRAMUSCULAR | Status: AC
  Administered 2012-08-05: 4 mg via INTRAVENOUS
  Filled 2012-08-05: qty 1

## 2012-08-05 MED ORDER — NAPROXEN 500 MG PO TABS: 500.0000 mg | ORAL_TABLET | Freq: Two times a day (BID) | ORAL | Status: DC

## 2012-08-05 NOTE — ED Notes (Signed)
Pt says also has generalized abd pain.  Describes chest and abd pain as sharp for past few days.  Says vomited once.

## 2012-08-05 NOTE — ED Notes (Signed)
Pt c/o chest pain x 3 days.  Reports is worse with movement.    Denies cold symptoms or injury to chest.  Pt says her husband told her she was sleep walking during the night and woke up with pain in r wrist.  Pt doesn't know if she fell or not.

## 2012-08-05 NOTE — ED Provider Notes (Signed)
History    This chart was scribed for Tracy Booze, MD, MD by Smitty Pluck. The patient was seen in room APA02 and the patient's care was started at 10:25AM.   CSN: [id number]  Arrival date & time 08/05/12  1003       Chief Complaint  Patient presents with  . Chest Pain    (Consider location/radiation/quality/duration/timing/severity/associated sxs/prior treatment) Patient is a 21 y.o. female presenting with chest pain. The history is provided by the patient. No language interpreter was used.  Chest Pain Primary symptoms include shortness of breath, cough and abdominal pain. Pertinent negatives for primary symptoms include no nausea and no vomiting.    Tracy Petersen is a 21 y.o. female with hx of acid reflux, hypothyroidism, bipolar disorder, personality disorder and depression who presents to the Emergency Department complaining of constant, moderate, sharp, central chest pain onset 4 days ago. Pt reports chest pain is aggravated by breathing. She reports having sharp abdominal pain. She states the current pain is different from pain in the past. She has non-productive cough, neck pain, back pain, diaphoresis and SOB. Denies fever and chills. She started new medication 1 day ago but symptoms were present before taking medication. Pt denies smoking cigarettes and drinking alcohol.   PCP is Dr. Loney Hering  Past Medical History  Diagnosis Date  . Personality disorder   . Bipolar 1 disorder   . Acid reflux   . Hypothyroidism   . Depression with anxiety     Past Surgical History  Procedure Date  . Cholecystectomy   . Surgery as a baby     unsure what it was    Family History  Problem Relation Age of Onset  . Colon cancer Neg Hx     History  Substance Use Topics  . Smoking status: Never Smoker   . Smokeless tobacco: Not on file  . Alcohol Use: No    OB History    Grav Para Term Preterm Abortions TAB SAB Ect Mult Living                  Review of Systems  HENT:  Positive for neck pain.   Respiratory: Positive for cough and shortness of breath.   Cardiovascular: Positive for chest pain.  Gastrointestinal: Positive for abdominal pain. Negative for nausea and vomiting.  All other systems reviewed and are negative.    Allergies  Penicillins  Home Medications   Current Outpatient Rx  Name Route Sig Dispense Refill  . CLONAZEPAM 0.5 MG PO TABS Oral Take 0.5-1 mg by mouth 2 (two) times daily. Takes 0.5 mg (1 tab) in the morning and 2 tabs at bedtime    . FLUOXETINE HCL 40 MG PO CAPS Oral Take 40 mg by mouth daily.    Marland Kitchen HYDROCODONE-IBUPROFEN 7.5-200 MG PO TABS Oral Take 1 tablet by mouth every 8 (eight) hours as needed. pain    . LEVOTHYROXINE SODIUM 25 MCG PO TABS Oral Take 25 mcg by mouth daily.    Marland Kitchen LITHIUM CARBONATE 300 MG PO CAPS Oral Take 300-600 mg by mouth 2 (two) times daily. Takes 1 capsule (300 mg) in the morning and takes 2 capsules (600mg ) at bedtime    . LUBIPROSTONE 24 MCG PO CAPS Oral Take 1 capsule (24 mcg total) by mouth 2 (two) times daily with a meal. 60 capsule 1  . TRINESSA (28) PO Oral Take 1 tablet by mouth daily.    Marland Kitchen OMEPRAZOLE 20 MG PO CPDR Oral Take 20 mg  by mouth daily.    Marland Kitchen ALIGN PO Oral Take 1 capsule by mouth daily.    Marland Kitchen PROMETHAZINE HCL 25 MG PO TABS Oral Take 25 mg by mouth every 6 (six) hours as needed. nausea      BP 126/86  Pulse 111  Temp 98.1 F (36.7 C) (Oral)  Resp 17  Ht 5\' 6"  (1.676 m)  Wt 235 lb (106.595 kg)  BMI 37.93 kg/m2  SpO2 96%  LMP 07/17/2012  Physical Exam  Nursing note and vitals reviewed. Constitutional: She is oriented to person, place, and time. She appears well-developed and well-nourished. No distress.       Obese   HENT:  Head: Normocephalic and atraumatic.  Eyes: EOM are normal. Pupils are equal, round, and reactive to light.  Neck: Normal range of motion. Neck supple. No tracheal deviation present.  Cardiovascular: Normal rate, regular rhythm and normal heart sounds.     Pulmonary/Chest: Effort normal. No respiratory distress. She exhibits tenderness (moderate anterior chest wall).  Abdominal: Soft. She exhibits no distension. There is tenderness (mild mid abdominal ). There is no rebound and no guarding.  Musculoskeletal: Normal range of motion. She exhibits edema (trace).  Neurological: She is alert and oriented to person, place, and time.  Skin: Skin is warm and dry.  Psychiatric: She has a normal mood and affect. Her behavior is normal.    ED Course  Procedures (including critical care time) DIAGNOSTIC STUDIES: Oxygen Saturation is 96% on room air, normal by my interpretation.    COORDINATION OF CARE: 10:30 AM Discussed ED treatment with pt  10:33 AM Ordered:  Medications  ketorolac (TORADOL) 30 MG/ML injection 30 mg (not administered)       Results for orders placed during the hospital encounter of 08/05/12  CBC WITH DIFFERENTIAL      Component Value Range   WBC 10.5  4.0 - 10.5 K/uL   RBC 4.42  3.87 - 5.11 MIL/uL   Hemoglobin 13.1  12.0 - 15.0 g/dL   HCT 40.9  81.1 - 91.4 %   MCV 89.1  78.0 - 100.0 fL   MCH 29.6  26.0 - 34.0 pg   MCHC 33.2  30.0 - 36.0 g/dL   RDW 78.2  95.6 - 21.3 %   Platelets 271  150 - 400 K/uL   Neutrophils Relative 65  43 - 77 %   Neutro Abs 6.8  1.7 - 7.7 K/uL   Lymphocytes Relative 27  12 - 46 %   Lymphs Abs 2.8  0.7 - 4.0 K/uL   Monocytes Relative 5  3 - 12 %   Monocytes Absolute 0.6  0.1 - 1.0 K/uL   Eosinophils Relative 3  0 - 5 %   Eosinophils Absolute 0.3  0.0 - 0.7 K/uL   Basophils Relative 0  0 - 1 %   Basophils Absolute 0.0  0.0 - 0.1 K/uL  D-DIMER, QUANTITATIVE      Component Value Range   D-Dimer, Quant 0.68 (*) 0.00 - 0.48 ug/mL-FEU  COMPREHENSIVE METABOLIC PANEL      Component Value Range   Sodium 138  135 - 145 mEq/L   Potassium 3.9  3.5 - 5.1 mEq/L   Chloride 105  96 - 112 mEq/L   CO2 27  19 - 32 mEq/L   Glucose, Bld 110 (*) 70 - 99 mg/dL   BUN 8  6 - 23 mg/dL   Creatinine, Ser 0.86   0.50 - 1.10 mg/dL   Calcium  9.9  8.4 - 10.5 mg/dL   Total Protein 7.5  6.0 - 8.3 g/dL   Albumin 3.0 (*) 3.5 - 5.2 g/dL   AST 16  0 - 37 U/L   ALT 17  0 - 35 U/L   Alkaline Phosphatase 105  39 - 117 U/L   Total Bilirubin 0.1 (*) 0.3 - 1.2 mg/dL   GFR calc non Af Amer >90  >90 mL/min   GFR calc Af Amer >90  >90 mL/min  LIPASE, BLOOD      Component Value Range   Lipase 20  11 - 59 U/L   Dg Chest 2 View  08/05/2012  *RADIOLOGY REPORT*  Clinical Data: Chest pain for 3 days  CHEST - 2 VIEW  Comparison: 06/19/2012  Findings: Low lung volumes. Minimal enlargement of cardiac silhouette, likely accentuated by hypoinflation. Mediastinal contours and pulmonary vascularity normal. Minimal bibasilar atelectasis. Lungs otherwise clear. Bones unremarkable. No pneumothorax.  IMPRESSION: Hypoinflated lungs with minimal bibasilar atelectasis.   Original Report Authenticated By: Lollie Marrow, M.D.    Ct Angio Chest W/cm &/or Wo Cm  08/05/2012  *RADIOLOGY REPORT*  Clinical Data: Chest pain and shortness of breath.  CT ANGIOGRAPHY CHEST  Technique:  Multidetector CT imaging of the chest using the standard protocol during bolus administration of intravenous contrast. Multiplanar reconstructed images including MIPs were obtained and reviewed to evaluate the vascular anatomy.  Contrast: OMNIPAQUE IOHEXOL 350 MG/ML SOLN  Comparison: No priors.  Findings:  Mediastinum: Study is slightly limited by suboptimal contrast bolus and some respiratory motion.  With these limitations in mind there is no evidence to suggest clinically relevant central, lobar or segmental sized pulmonary embolism.  Smaller subsegmental sized emboli cannot be completely excluded. Heart size is mildly enlarged. There is no significant pericardial fluid, thickening or pericardial calcification. No pathologically enlarged mediastinal or hilar lymph nodes. Esophagus is unremarkable in appearance.  Lungs/Pleura: No acute consolidative airspace  disease.  No pleural effusions.  No suspicious appearing pulmonary nodules or masses are identified.  Upper Abdomen: Status post cholecystectomy.  Musculoskeletal: There are no aggressive appearing lytic or blastic lesions noted in the visualized portions of the skeleton.  IMPRESSION: 1.  Despite the mild limitations of this examination there is no evidence to suggest clinically relevant central, lobar or segmental sized pulmonary embolism. 2.  Mild cardiomegaly. 3.  Status post cholecystectomy.   Original Report Authenticated By: Florencia Reasons, M.D.     Date: 08/05/2012  Rate: 110  Rhythm: sinus tachycardia  QRS Axis: normal  Intervals: normal  ST/T Wave abnormalities: nonspecific T wave changes  Conduction Disutrbances:none  Narrative Interpretation: Minor nonspecific T wave changes. Sinus tachycardia. No prior ECG available for comparison.  Old EKG Reviewed: none available    1. Pleuritic chest pain       MDM  Pleuritic chest pain which may be costochondritis. She'll be given a therapeutic trial of ketorolac and chest x-ray obtained.  Chest x-ray does not show any pathology. She states she feels worse after taking the ketorolac. D-dimer is obtained to rule out pulmonary embolism.  D-dimer is come back elevated. She is sent for CT angiogram which is no evidence of pulmonary emboli. In spite of complaint of severe pain, she has been resting comfortably and showing no facial expressions indicate pain. She is sent home with prescriptions for naproxen for tramadol and she is to followup with her PCP.     I personally performed the services described in this documentation, which  was scribed in my presence. The recorded information has been reviewed and considered.      Tracy Booze, MD 08/05/12 [phone]

## 2012-08-05 NOTE — ED Notes (Signed)
Patient requested splint for right wrist that is hurting her but was not a complaint during visit. Dr. Preston Fleeting states patient can have splint.

## 2012-08-11 ENCOUNTER — Encounter: Payer: Self-pay | Admitting: Urgent Care

## 2012-08-11 ENCOUNTER — Ambulatory Visit (INDEPENDENT_AMBULATORY_CARE_PROVIDER_SITE_OTHER): Payer: Medicaid Other | Admitting: Urgent Care

## 2012-08-11 VITALS — BP 129/77 | HR 103 | Temp 98.0°F | Ht 66.0 in | Wt 253.8 lb

## 2012-08-11 DIAGNOSIS — R110 Nausea: Secondary | ICD-10-CM | POA: Insufficient documentation

## 2012-08-11 DIAGNOSIS — R197 Diarrhea, unspecified: Secondary | ICD-10-CM

## 2012-08-11 DIAGNOSIS — R109 Unspecified abdominal pain: Secondary | ICD-10-CM

## 2012-08-11 LAB — TSH: TSH: 6.03 u[IU]/mL — ABNORMAL HIGH (ref 0.350–4.500)

## 2012-08-11 MED ORDER — PANTOPRAZOLE SODIUM 40 MG PO TBEC: 40.0000 mg | DELAYED_RELEASE_TABLET | Freq: Every day | ORAL | Status: DC

## 2012-08-11 NOTE — Progress Notes (Signed)
CONSIDER EGD/FLEX SIG IN OR DUE TO POLYPHARMACY IF PT NOT CLINICALLY IMPROVED.  REVIEWED.

## 2012-08-11 NOTE — Patient Instructions (Addendum)
You may use phenergan you have for nausea as directed Go get your labs today We have requested your colonoscopy & EGD reports from Dr Loney Loh at [facility] from around 2010 You will need to set up an appt with your primary care provider, Dr Loney Hering to follow up on chest pain.  Please call today. Stop omeprazole 20mg  daily.  Begin pantoprazole 40mg  daily. We will call you with further recommendations once labs are reviewed.

## 2012-08-11 NOTE — Progress Notes (Addendum)
Primary Care Physician:  Ernestine Conrad, MD Primary Gastroenterologist:  Dr. Jonette Eva Psychiatrist:  Dr. Sherlean Foot, [facility]  Chief Complaint  Patient presents with  . Follow-up    nausea, abdominal pain, diarrhea    HPI:  Tracy Petersen is a 21 y.o. female here for follow up for chronic abdominal pain, nausea & alternating bowel habits.  She was seen by Gerrit Halls, NP 2 weeks ago for same symptoms.  She had an ER visit for cough, SOB & chest pain.  She started taking Naprosyn & tramadol for pain 4 days ago after visit.  Tramadol is not helping.  C/o anorexia.  "Every time I eat, I have diarrhea."  C/o loose stools twice daily.  C/o RUQ pain & radiates across mid-abdomen.  10/10 at first now 20/10.  C/o constant abdominal pain.  C/o chronic nausea that is constant & worse w/ eating.  Strange coughs.  Denies dysphagia.  C/o pill dysphagia & feels like stuck in throat.  Drinks fluids to flush & no help.    Not taking Linzess due to abdominal pain & diarrhea.  Omeprazole 20mg  every morning helps with heartburn but not nausea or chest pain.   ALIGN daily.  Denies rectal bleeding or melena.  She is steadily gaining weight.  Followed by  Dr Sherlean Foot at [facility] in [place], [place].  No fever + chills.  No recent antibiotics, foreign travel, or new medications.   She cannot remember last time lithium or thyroid levels were checked.  She believes she has had a colonosocpy & EGD in 2010 or 2011 by Dr Herschel Senegal prior to cholecystectomy at [facility] but we were unable to find any records there other than cholecystectomy report.    Past Medical History  Diagnosis Date  . Borderline personality disorder   . Bipolar 1 disorder   . Acid reflux   . Hypothyroidism   . Depression with anxiety     Past Surgical History  Procedure Date  . Cholecystectomy 06/11/09    chronic cholecystitis  . Surgery as a baby     few months old, ?pyloric stenosis  . Liver biopsy 06/11/09    [place]-No  cirrhosis or bridging fibrosis, minimal portal lymphocytic inflammation & mild microvesicular steatosis    Current Outpatient Prescriptions  Medication Sig Dispense Refill  . clonazePAM (KLONOPIN) 0.5 MG tablet Take 0.5-1 mg by mouth 2 (two) times daily. Takes 0.5 mg (1 tab) in the morning and 2 tabs at bedtime      . FLUoxetine (PROZAC) 40 MG capsule Take 40 mg by mouth daily.      Marland Kitchen HYDROcodone-ibuprofen (VICOPROFEN) 7.5-200 MG per tablet Take 1 tablet by mouth every 8 (eight) hours as needed. pain      . levothyroxine (SYNTHROID, LEVOTHROID) 25 MCG tablet Take 25 mcg by mouth daily.      . Linaclotide (LINZESS) 290 MCG CAPS Take 290 mcg by mouth daily.      Marland Kitchen lithium carbonate 300 MG capsule Take 300-600 mg by mouth 2 (two) times daily. Takes 1 capsule (300 mg) in the morning and takes 2 capsules (600mg ) at bedtime      . naproxen (NAPROSYN) 500 MG tablet Take 1 tablet (500 mg total) by mouth 2 (two) times daily.  30 tablet  0  . Norgestim-Eth Estrad Triphasic (TRINESSA, 28, PO) Take 1 tablet by mouth daily.      Marland Kitchen omeprazole (PRILOSEC) 20 MG capsule Take 20 mg by mouth daily.      Marland Kitchen  Probiotic Product (ALIGN PO) Take 1 capsule by mouth daily.      . promethazine (PHENERGAN) 25 MG tablet Take 25 mg by mouth every 6 (six) hours as needed. nausea      . traMADol (ULTRAM) 50 MG tablet Take 1 tablet (50 mg total) by mouth every 6 (six) hours as needed for pain.  15 tablet  0  . pantoprazole (PROTONIX) 40 MG tablet Take 1 tablet (40 mg total) by mouth daily.  30 tablet  5    Allergies as of 08/11/2012 - Review Complete 08/11/2012  Allergen Reaction Noted  . Penicillins  06/19/2012   Family History  Problem Relation Age of Onset  . Adopted: Yes  . Colon cancer Neg Hx    History   Social History  . Marital Status: Married    Spouse Name: N/A    Number of Children: N/A  . Years of Education: N/A   Occupational History  . disabled    Social History Main Topics  . Smoking status:  Never Smoker   . Smokeless tobacco: Not on file  . Alcohol Use: No  . Drug Use: No  . Sexually Active: Not on file   Other Topics Concern  . Not on file   Social History Narrative   Lives in [place] at [facility]   Review of Systems: See HPI, otherwise negative ROS  Physical Exam: BP 129/77  Pulse 103  Temp 98 F (36.7 C) (Temporal)  Ht 5\' 6"  (1.676 m)  Wt 253 lb 12.8 oz (115.123 kg)  BMI 40.96 kg/m2  LMP 07/17/2012 General:   Alert,  Well-developed, well-nourished, pleasant and cooperative in NAD. + stutter. Eyes:  Sclera clear, no icterus.   Conjunctiva pink. Mouth:  No deformity or lesions, oropharynx pink and moist. Neck:  Supple; no masses or thyromegaly. Heart:  Regular rate and rhythm; no murmurs, clicks, rubs,  or gallops. Abdomen:  Normal bowel sounds.  No bruits.  Soft, non-tender and non-distended without masses, hepatosplenomegaly or hernias noted.  No guarding or rebound tenderness.   Rectal:  Deferred. Msk:  Symmetrical without gross deformities.  Pulses:  Normal pulses noted. Extremities:  No clubbing or edema. Neurologic:  Alert and oriented x4;  grossly normal neurologically. Skin:  Intact without significant lesions or rashes.

## 2012-08-11 NOTE — Assessment & Plan Note (Signed)
Diarrhea, hx alternating with constipation.  TTG IgA to r/o celiac disease.  She does have low albumin.  ?malabsorption. May need further work-up if diarrhea persists.

## 2012-08-11 NOTE — Progress Notes (Signed)
REVIEWED.  

## 2012-08-11 NOTE — Assessment & Plan Note (Addendum)
Chronic nausea without vomiting.  Associated chest pain with minimal response to omeprazole.  Differentials include gastroparesis, gastritis (+/- h pylori), poorly-controlled GERD, PUD or functional component.  Need to r/o hypothyroidism & lithium toxicity.  Consider EGD if no response to changing to pantoprazole 40mg  daily, followed by gastric emptying study. TSH, lithium level Pt may use phenergan given in ER as directed

## 2012-08-11 NOTE — Progress Notes (Signed)
Faxed to PCP

## 2012-08-12 LAB — TISSUE TRANSGLUTAMINASE, IGA: Tissue Transglutaminase Ab, IgA: 2.8 U/mL (ref <20)

## 2012-08-15 ENCOUNTER — Other Ambulatory Visit: Payer: Self-pay | Admitting: Urgent Care

## 2012-08-15 ENCOUNTER — Telehealth: Payer: Self-pay | Admitting: Urgent Care

## 2012-08-15 ENCOUNTER — Other Ambulatory Visit: Payer: Self-pay | Admitting: Gastroenterology

## 2012-08-15 MED ORDER — PEG 3350-KCL-NA BICARB-NACL 420 G PO SOLR: 4000.0000 mL | ORAL | Status: DC

## 2012-08-15 NOTE — Telephone Encounter (Signed)
Per Ginger, she entered the new phone numbers.

## 2012-08-15 NOTE — Progress Notes (Signed)
Quick Note:  See phone note Cc:BLUTH, KIRK, MD  ______ 

## 2012-08-15 NOTE — Progress Notes (Signed)
Faxed to Dr Bluth 

## 2012-08-15 NOTE — Telephone Encounter (Signed)
Results given to pt. Pt moving to new care home today-[facility] in [place]. She will call back with new number to arrange below.

## 2012-08-15 NOTE — Telephone Encounter (Signed)
LM for return call as pt unavailable.  TSH is abnormal.  She will need appt with Ernestine Conrad, MD ASAP. Please arrange this appt for her.    Also, please arrange EGD & colonoscopy IN OR with PROPOFOL DUE TO POLYPHARMACY w/ Dr Darrick Penna if still w/ N/V/D.  I can speak with her when she returns call.  Thanks

## 2012-08-16 NOTE — Telephone Encounter (Signed)
Thanks

## 2012-08-16 NOTE — Telephone Encounter (Signed)
Called and spoke to pt's husband, Thayer Ohm ( He said pt was unavailable). He said the last place the pt was seen was [facility] by Dr. Fran Lowes. I told him pt needs to be seen at once due to her TSH results. He said they would have to establish somewhere else. ( I did call [facility] and spoke to [place] who said that pt has a follow up appt on 08/26/2012. She said that the [facility] brings their patients there and pt's have the right to come if it is their choice). I called and spoke to Lucienne Capers, the administrator  ( cell number [phone]) . She said they had problems with getting meds when needed because they could not get appts until after meds were needed at [facility],  so they switched to [facility]. She said she will take the patient right away. Soledad Gerlach has faxed the lab to [facility].

## 2012-08-16 NOTE — Telephone Encounter (Signed)
Patient is scheduled for TCS/EGD with Dr. Darrick Penna in the OR and instructions were faxed to her new group home and they will take her to [facility] to follow her abnormal TSH

## 2012-08-17 ENCOUNTER — Encounter (HOSPITAL_COMMUNITY): Payer: Self-pay | Admitting: Pharmacy Technician

## 2012-08-19 NOTE — Patient Instructions (Addendum)
18 Tracy Petersen  08/19/2012   Your procedure is scheduled on:   08/30/2012  Report to [facility] at  915  AM.  Call this number if you have problems the morning of surgery: [phone]   Remember:   Do not eat food:After Midnight.  May have clear liquids:until Midnight .    Take these medicines the morning of surgery with A SIP OF WATER: Protonix, Levothyroxine, Lithium, Fluoxetine, and Clonazepam. Take Tramadol, Phenergan, and Hydrocodone only if needed.   Do not wear jewelry, make-up or nail polish.  Do not wear lotions, powders, or perfumes. You may wear deodorant.  Do not shave 48 hours prior to surgery. Men may shave face and neck.  Do not bring valuables to the hospital.  Contacts, dentures or bridgework may not be worn into surgery.  Leave suitcase in the car. After surgery it may be brought to your room.    Patients discharged the day of surgery will not be allowed to drive home.  Special Instructions: Start using your bowel prep as directed by your doctor.   Please read over the following fact sheets that you were given: Pain Booklet, Coughing and Deep Breathing, MRSA Information, Surgical Site Infection Prevention, Anesthesia Post-op Instructions and Care and Recovery After Surgery   Colonoscopy A colonoscopy is an exam to evaluate your entire colon. In this exam, your colon is cleansed. A long fiberoptic tube is inserted through your rectum and into your colon. The fiberoptic scope (endoscope) is a long bundle of enclosed and very flexible fibers. These fibers transmit light to the area examined and send images from that area to your caregiver. Discomfort is usually minimal. You may be given a drug to help you sleep (sedative) during or prior to the procedure. This exam helps to detect lumps (tumors), polyps, inflammation, and areas of bleeding. Your caregiver may also take a small piece of tissue (biopsy) that will be examined under a microscope. LET YOUR CAREGIVER KNOW ABOUT:     Allergies to food or medicine.  Medicines taken, including vitamins, herbs, eyedrops, over-the-counter medicines, and creams.  Use of steroids (by mouth or creams).  Previous problems with anesthetics or numbing medicines.  History of bleeding problems or blood clots.  Previous surgery.  Other health problems, including diabetes and kidney problems.  Possibility of pregnancy, if this applies. BEFORE THE PROCEDURE   A clear liquid diet may be required for 2 days before the exam.  Ask your caregiver about changing or stopping your regular medications.  Liquid injections (enemas) or laxatives may be required.  A large amount of electrolyte solution may be given to you to drink over a short period of time. This solution is used to clean out your colon.  You should be present 60 minutes prior to your procedure or as directed by your caregiver. AFTER THE PROCEDURE   If you received a sedative or pain relieving medication, you will need to arrange for someone to drive you home.  Occasionally, there is a little blood passed with the first bowel movement. Do not be concerned. FINDING OUT THE RESULTS OF YOUR TEST Not all test results are available during your visit. If your test results are not back during the visit, make an appointment with your caregiver to find out the results. Do not assume everything is normal if you have not heard from your caregiver or the medical facility. It is important for you to follow up on all of your test results. HOME CARE INSTRUCTIONS  It is not unusual to pass moderate amounts of gas and experience mild abdominal cramping following the procedure. This is due to air being used to inflate your colon during the exam. Walking or a warm pack on your belly (abdomen) may help.  You may resume all normal meals and activities after sedatives and medicines have worn off.  Only take over-the-counter or prescription medicines for pain, discomfort, or fever as  directed by your caregiver. Do not use aspirin or blood thinners if a biopsy was taken. Consult your caregiver for medicine usage if biopsies were taken. SEEK IMMEDIATE MEDICAL CARE IF:   You have a fever.  You pass large blood clots or fill a toilet with blood following the procedure. This may also occur 10 to 14 days following the procedure. This is more likely if a biopsy was taken.  You develop abdominal pain that keeps getting worse and cannot be relieved with medicine. Document Released: 10/09/2000 Document Revised: 01/04/2012 Document Reviewed: 05/24/2008 [facility] Patient Information 2013 [place], [place].   Esophagogastroduodenoscopy This is an endoscopic procedure (a procedure that uses a device like a flexible telescope) that allows your caregiver to view the upper stomach and small bowel. This test allows your caregiver to look at the esophagus. The esophagus carries food from your mouth to your stomach. They can also look at your duodenum. This is the first part of the small intestine that attaches to the stomach. This test is used to detect problems in the bowel such as ulcers and inflammation. PREPARATION FOR TEST Nothing to eat after midnight the day before the test. NORMAL FINDINGS Normal esophagus, stomach, and duodenum. Ranges for normal findings may vary among different laboratories and hospitals. You should always check with your doctor after having lab work or other tests done to discuss the meaning of your test results and whether your values are considered within normal limits. MEANING OF TEST  Your caregiver will go over the test results with you and discuss the importance and meaning of your results, as well as treatment options and the need for additional tests if necessary. OBTAINING THE TEST RESULTS It is your responsibility to obtain your test results. Ask the lab or department performing the test when and how you will get your results. Document Released: 02/12/2005  Document Revised: 01/04/2012 Document Reviewed: 09/21/2008 [facility] Patient Information 2013 [place], [place].   PATIENT INSTRUCTIONS POST-ANESTHESIA  IMMEDIATELY FOLLOWING SURGERY:  Do not drive or operate machinery for the first twenty four hours after surgery.  Do not make any important decisions for twenty four hours after surgery or while taking narcotic pain medications or sedatives.  If you develop intractable nausea and vomiting or a severe headache please notify your doctor immediately.  FOLLOW-UP:  Please make an appointment with your surgeon as instructed. You do not need to follow up with anesthesia unless specifically instructed to do so.  WOUND CARE INSTRUCTIONS (if applicable):  Keep a dry clean dressing on the anesthesia/puncture wound site if there is drainage.  Once the wound has quit draining you may leave it open to air.  Generally you should leave the bandage intact for twenty four hours unless there is drainage.  If the epidural site drains for more than 36-48 hours please call the anesthesia department.  QUESTIONS?:  Please feel free to call your physician or the hospital operator if you have any questions, and they will be happy to assist you.

## 2012-08-22 ENCOUNTER — Other Ambulatory Visit: Payer: Self-pay

## 2012-08-22 ENCOUNTER — Emergency Department (HOSPITAL_COMMUNITY)
Admission: EM | Admit: 2012-08-22 | Discharge: 2012-08-22 | Disposition: A | Discharge status: Home / Self Care | Payer: Medicaid Other | Attending: Emergency Medicine | Admitting: Emergency Medicine

## 2012-08-22 ENCOUNTER — Other Ambulatory Visit: Payer: Self-pay | Admitting: Gastroenterology

## 2012-08-22 ENCOUNTER — Encounter (HOSPITAL_COMMUNITY): Payer: Self-pay | Admitting: Emergency Medicine

## 2012-08-22 ENCOUNTER — Encounter (HOSPITAL_COMMUNITY)
Admission: RE | Admit: 2012-08-22 | Discharge: 2012-08-22 | Payer: Medicaid Other | Source: Ambulatory Visit | Attending: Gastroenterology | Admitting: Gastroenterology

## 2012-08-22 DIAGNOSIS — K219 Gastro-esophageal reflux disease without esophagitis: Secondary | ICD-10-CM | POA: Insufficient documentation

## 2012-08-22 DIAGNOSIS — F603 Borderline personality disorder: Secondary | ICD-10-CM | POA: Insufficient documentation

## 2012-08-22 DIAGNOSIS — R0602 Shortness of breath: Secondary | ICD-10-CM | POA: Insufficient documentation

## 2012-08-22 DIAGNOSIS — F341 Dysthymic disorder: Secondary | ICD-10-CM | POA: Insufficient documentation

## 2012-08-22 DIAGNOSIS — Z792 Long term (current) use of antibiotics: Secondary | ICD-10-CM | POA: Insufficient documentation

## 2012-08-22 DIAGNOSIS — R071 Chest pain on breathing: Secondary | ICD-10-CM | POA: Insufficient documentation

## 2012-08-22 DIAGNOSIS — Z79899 Other long term (current) drug therapy: Secondary | ICD-10-CM | POA: Insufficient documentation

## 2012-08-22 DIAGNOSIS — R0789 Other chest pain: Secondary | ICD-10-CM

## 2012-08-22 DIAGNOSIS — E039 Hypothyroidism, unspecified: Secondary | ICD-10-CM | POA: Insufficient documentation

## 2012-08-22 DIAGNOSIS — F319 Bipolar disorder, unspecified: Secondary | ICD-10-CM | POA: Insufficient documentation

## 2012-08-22 MED ORDER — NAPROXEN 500 MG PO TABS: 500.0000 mg | ORAL_TABLET | Freq: Two times a day (BID) | ORAL | Status: DC

## 2012-08-22 MED ORDER — ALBUTEROL SULFATE HFA 108 (90 BASE) MCG/ACT IN AERS: 1.0000 | INHALATION_SPRAY | Freq: Four times a day (QID) | RESPIRATORY_TRACT | Status: AC | PRN

## 2012-08-22 NOTE — ED Notes (Signed)
Pt states saw pcp atleast 4 days ago and sent prescription pro-air inhaler. Pt has not had medication yet due to pharmacy not getting electronic script.NAD noted at thsi time.

## 2012-08-22 NOTE — Progress Notes (Signed)
Pt came in for pre-op interview for EGD/TCS in OR and was c/o mid to upper chest pain rating 10/10, sharp and stabbing and SOB that has been going on for "awhile now" per pt. Vital signs obtained, HR 119, Resp 22, BP 127/108, O2 sat 89%. [facility] Pro-Air and sent it electronically to the pharmacy, but the pharmacy hasn't been able to fill it bc they haven't received it yet. EKG obtained showing Sinus Tach, HR of 106. I spoke with Verlon Au, RN in the ER and notified her that I was sending pt over due to her symptoms and abnormal VS. I explained to pt the reason for stopping the pre-op interview and sending her to the ER and she understood, pt was then taken to the ER.

## 2012-08-22 NOTE — ED Notes (Signed)
Pt was here for pre work up for having a colonoscopy. Pt states started having worsening SOB with tightness in chest. EKG performed in Day surgery and pt was sent to ED for evaluation.

## 2012-08-22 NOTE — ED Provider Notes (Signed)
History   This chart was scribed for Shelda Jakes, MD by Gerlean Ren. This patient was seen in room APA18/APA18 and the patient's care was started at 11:45.   CSN: [id number]  Arrival date & time 08/22/12  1054   First MD Initiated Contact with Patient 08/22/12 1131      Chief Complaint  Patient presents with  . Shortness of Breath  . Chest Pain    (Consider location/radiation/quality/duration/timing/severity/associated sxs/prior treatment) Patient is a 21 y.o. female presenting with chest pain. The history is provided by the patient. No language interpreter was used.  Chest Pain Primary symptoms include shortness of breath. Pertinent negatives for primary symptoms include no fever and no vomiting.    Tracy Petersen is a 21 y.o. female who presents to the Emergency Department complaining of 2.5 weeks of gradually-worsening stabbing pain in chest with associated chest tightness and dyspnea.  Pt was seen here 08/05/2012, diagnosed with pleuritic chest pain, and prescribed naproxen and tramadol which pt reports had only mild improvements to pain.  Pt saw PCP 5 days ago with same symptoms and was prescribed pro-air inhaler but has not been able to get it because of electronic error with prescription being sent to pharmacy.  Pt has not contacted PCP to resend script.  Pt has h/o borderline personality disorder, anxiety, and bipolar disorder.  Pt denies tobacco and alcohol use. Past Medical History  Diagnosis Date  . Borderline personality disorder   . Bipolar 1 disorder   . Acid reflux   . Hypothyroidism   . Depression with anxiety     Past Surgical History  Procedure Date  . Cholecystectomy 06/11/09    chronic cholecystitis  . Surgery as a baby     few months old, ?pyloric stenosis  . Liver biopsy 06/11/09    [place]-No cirrhosis or bridging fibrosis, minimal portal lymphocytic inflammation & mild microvesicular steatosis    Family History  Problem Relation Age of Onset  .  Adopted: Yes  . Colon cancer Neg Hx     History  Substance Use Topics  . Smoking status: Never Smoker   . Smokeless tobacco: Not on file  . Alcohol Use: No    No OB history provided.  Review of Systems  Constitutional: Negative for fever.  HENT: Negative for neck pain.   Eyes: Negative for visual disturbance.  Respiratory: Positive for chest tightness and shortness of breath.   Cardiovascular: Positive for chest pain.  Gastrointestinal: Negative for vomiting.  Genitourinary: Negative for dysuria.  Musculoskeletal: Negative for back pain.  Skin: Negative for rash.  Neurological: Negative for headaches.  Psychiatric/Behavioral: Negative for confusion.    Allergies  Penicillins  Home Medications   Current Outpatient Rx  Name Route Sig Dispense Refill  . RISAQUAD PO CAPS Oral Take 1 capsule by mouth daily.    . ALBUTEROL SULFATE HFA 108 (90 BASE) MCG/ACT IN AERS Inhalation Inhale 1-2 puffs into the lungs every 6 (six) hours as needed for wheezing. 1 Inhaler 0  . CLONAZEPAM 0.5 MG PO TABS Oral Take 0.5-1 mg by mouth at bedtime. Takes 0.5 mg (1 tab) in the morning and 2 tabs at bedtime    . FLUOXETINE HCL 40 MG PO CAPS Oral Take 40 mg by mouth daily.    Marland Kitchen HYDROCODONE-IBUPROFEN 7.5-200 MG PO TABS Oral Take 1 tablet by mouth every 8 (eight) hours as needed. pain    . ILOPERIDONE 4 MG PO TABS Oral Take 2 mg by mouth at bedtime.    Marland Kitchen  LEVOTHYROXINE SODIUM 25 MCG PO TABS Oral Take 25 mcg by mouth daily.    Marland Kitchen LINACLOTIDE 290 MCG PO CAPS Oral Take 290 mcg by mouth daily.    Marland Kitchen LITHIUM CARBONATE 300 MG PO CAPS Oral Take 300-600 mg by mouth 2 (two) times daily. Takes 1 capsule (300 mg) in the morning and takes 2 capsules (600mg ) at bedtime    . NAPROXEN 500 MG PO TABS Oral Take 1 tablet (500 mg total) by mouth 2 (two) times daily. 30 tablet 0  . NAPROXEN 500 MG PO TABS Oral Take 1 tablet (500 mg total) by mouth 2 (two) times daily. 14 tablet 1  . TRINESSA (28) PO Oral Take 1 tablet by  mouth daily.    Marland Kitchen PANTOPRAZOLE SODIUM 40 MG PO TBEC Oral Take 1 tablet (40 mg total) by mouth daily. 30 tablet 5  . PEG 3350-KCL-NA BICARB-NACL 420 G PO SOLR Oral Take 4,000 mLs by mouth as directed. 4000 mL 0  . PROMETHAZINE HCL 25 MG PO TABS Oral Take 25 mg by mouth every 6 (six) hours as needed. nausea    . TRAMADOL HCL 50 MG PO TABS Oral Take 1 tablet (50 mg total) by mouth every 6 (six) hours as needed for pain. 15 tablet 0    LMP 07/17/2012  Physical Exam  Nursing note and vitals reviewed. Constitutional: She is oriented to person, place, and time. She appears well-developed and well-nourished.  HENT:  Head: Normocephalic and atraumatic.  Eyes: EOM are normal.  Neck: Normal range of motion. No tracheal deviation present.  Cardiovascular: Normal rate, regular rhythm and normal heart sounds.   No murmur heard. Pulmonary/Chest: Effort normal and breath sounds normal. She has no wheezes.  Abdominal: Soft. Bowel sounds are normal. There is no tenderness.  Musculoskeletal: Normal range of motion. She exhibits no edema.  Neurological: She is alert and oriented to person, place, and time.  Skin: Skin is warm.  Psychiatric: She has a normal mood and affect.    ED Course  Procedures (including critical care time) DIAGNOSTIC STUDIES: No O2 vital taken.  COORDINATION OF CARE: 11:54- Patient and family informed of clinical course, understands medical decision-making process, and agrees with plan.     Labs Reviewed - No data to display No results found.  Date: 08/22/2012  Rate: 102  Rhythm: sinus tachycardia  QRS Axis: normal  Intervals: normal  ST/T Wave abnormalities: nonspecific T wave changes  Conduction Disutrbances:none  Narrative Interpretation:   Old EKG Reviewed: unchanged Unchanged from earlier today.  Results for orders placed in visit on 08/11/12  TISSUE TRANSGLUTAMINASE, IGA      Component Value Range   Tissue Transglutaminase Ab, IgA 2.8  <20 U/mL  TSH       Component Value Range   TSH 6.030 (*) 0.350 - 4.500 uIU/mL  LITHIUM LEVEL      Component Value Range   Lithium Lvl 0.80  0.80 - 1.40 mEq/L   Dg Chest 2 View  08/05/2012  *RADIOLOGY REPORT*  Clinical Data: Chest pain for 3 days  CHEST - 2 VIEW  Comparison: 06/19/2012  Findings: Low lung volumes. Minimal enlargement of cardiac silhouette, likely accentuated by hypoinflation. Mediastinal contours and pulmonary vascularity normal. Minimal bibasilar atelectasis. Lungs otherwise clear. Bones unremarkable. No pneumothorax.  IMPRESSION: Hypoinflated lungs with minimal bibasilar atelectasis.   Original Report Authenticated By: Lollie Marrow, M.D.    Ct Angio Chest W/cm &/or Wo Cm  08/05/2012  *RADIOLOGY REPORT*  Clinical Data: Chest  pain and shortness of breath.  CT ANGIOGRAPHY CHEST  Technique:  Multidetector CT imaging of the chest using the standard protocol during bolus administration of intravenous contrast. Multiplanar reconstructed images including MIPs were obtained and reviewed to evaluate the vascular anatomy.  Contrast: OMNIPAQUE IOHEXOL 350 MG/ML SOLN  Comparison: No priors.  Findings:  Mediastinum: Study is slightly limited by suboptimal contrast bolus and some respiratory motion.  With these limitations in mind there is no evidence to suggest clinically relevant central, lobar or segmental sized pulmonary embolism.  Smaller subsegmental sized emboli cannot be completely excluded. Heart size is mildly enlarged. There is no significant pericardial fluid, thickening or pericardial calcification. No pathologically enlarged mediastinal or hilar lymph nodes. Esophagus is unremarkable in appearance.  Lungs/Pleura: No acute consolidative airspace disease.  No pleural effusions.  No suspicious appearing pulmonary nodules or masses are identified.  Upper Abdomen: Status post cholecystectomy.  Musculoskeletal: There are no aggressive appearing lytic or blastic lesions noted in the visualized portions of  the skeleton.  IMPRESSION: 1.  Despite the mild limitations of this examination there is no evidence to suggest clinically relevant central, lobar or segmental sized pulmonary embolism. 2.  Mild cardiomegaly. 3.  Status post cholecystectomy.   Original Report Authenticated By: Florencia Reasons, M.D.        1. Chest wall pain       MDM  Patient seen October 11 for same complaint had the chest x-ray and CT angios without specific findings. Patient did followup with primary care provider was supposed to start using albuterol but did not get it filled because the prescription did not transmit. Patient was seen complaints today left anterior upper chest pain today's EKG without any acute findings. Have reviewed the workup on the 11th which was very extensive diagnosis then was a Pleuritic chest pain I concur with the symptoms. We'll have them recheck room air pulse ox. Upon presentation it was 89%. On my examination patient in no acute distress.  Is discharged home plan will be to continue Naprosyn and start using albuterol inhaler. No wheezing rales or rhonchi on exam.    I personally performed the services described in this documentation, which was scribed in my presence. The recorded information has been reviewed and considered.          Shelda Jakes, MD 08/22/12 [phone]

## 2012-08-22 NOTE — ED Notes (Signed)
Pt arrived by wheelchair from short stay, was registering for pre-op colonoscopy. And told the staff that she has been having chest pain off/on for days, pain worse when she takes deep breathes. Denies fever, +cough at times, has been out of her pro-air for several days. Pt resides in group home. Has caregiver w/ her at bedside.

## 2012-08-23 ENCOUNTER — Encounter (HOSPITAL_COMMUNITY): Payer: Self-pay

## 2012-08-23 ENCOUNTER — Encounter (HOSPITAL_COMMUNITY)
Admission: RE | Admit: 2012-08-23 | Discharge: 2012-08-23 | Disposition: A | Discharge status: Home / Self Care | Payer: Medicaid Other | Source: Ambulatory Visit | Attending: Gastroenterology | Admitting: Gastroenterology

## 2012-08-23 HISTORY — DX: Unspecified asthma, uncomplicated: J45.909

## 2012-08-23 LAB — HCG, SERUM, QUALITATIVE: Preg, Serum: NEGATIVE

## 2012-08-24 ENCOUNTER — Telehealth: Payer: Self-pay | Admitting: Gastroenterology

## 2012-08-24 NOTE — Telephone Encounter (Signed)
PLEASE CALL PT.  HER URINE PREGNANCY TEST IS NEG.

## 2012-08-30 ENCOUNTER — Encounter (HOSPITAL_COMMUNITY)
Admission: RE | Disposition: A | Discharge status: Home / Self Care | Payer: Self-pay | Source: Ambulatory Visit | Attending: Gastroenterology

## 2012-08-30 ENCOUNTER — Ambulatory Visit (HOSPITAL_COMMUNITY)
Admission: RE | Admit: 2012-08-30 | Discharge: 2012-08-30 | Disposition: A | Discharge status: Home / Self Care | Payer: Medicaid Other | Source: Ambulatory Visit | Attending: Gastroenterology | Admitting: Gastroenterology

## 2012-08-30 ENCOUNTER — Encounter (HOSPITAL_COMMUNITY): Payer: Self-pay | Admitting: *Deleted

## 2012-08-30 ENCOUNTER — Ambulatory Visit (HOSPITAL_COMMUNITY): Payer: Medicaid Other | Admitting: Anesthesiology

## 2012-08-30 ENCOUNTER — Encounter (HOSPITAL_COMMUNITY): Payer: Self-pay | Admitting: Anesthesiology

## 2012-08-30 DIAGNOSIS — K648 Other hemorrhoids: Secondary | ICD-10-CM

## 2012-08-30 DIAGNOSIS — R112 Nausea with vomiting, unspecified: Secondary | ICD-10-CM

## 2012-08-30 DIAGNOSIS — K6389 Other specified diseases of intestine: Secondary | ICD-10-CM | POA: Insufficient documentation

## 2012-08-30 DIAGNOSIS — R197 Diarrhea, unspecified: Secondary | ICD-10-CM

## 2012-08-30 DIAGNOSIS — R109 Unspecified abdominal pain: Secondary | ICD-10-CM | POA: Insufficient documentation

## 2012-08-30 DIAGNOSIS — K2940 Chronic atrophic gastritis without bleeding: Secondary | ICD-10-CM

## 2012-08-30 HISTORY — PX: ESOPHAGOGASTRODUODENOSCOPY (EGD) WITH PROPOFOL: SHX5813

## 2012-08-30 HISTORY — PX: COLONOSCOPY WITH PROPOFOL: SHX5780

## 2012-08-30 HISTORY — PX: BIOPSY: SHX5522

## 2012-08-30 HISTORY — DX: Other injury of unspecified body region, initial encounter: T14.8XXA

## 2012-08-30 SURGERY — COLONOSCOPY WITH PROPOFOL
Anesthesia: Monitor Anesthesia Care

## 2012-08-30 MED ORDER — GLYCOPYRROLATE 0.2 MG/ML IJ SOLN
0.2000 mg | Freq: Once | INTRAMUSCULAR | Status: AC
  Administered 2012-08-30: 0.2 mg via INTRAVENOUS

## 2012-08-30 MED ORDER — BUTAMBEN-TETRACAINE-BENZOCAINE 2-2-14 % EX AERO
1.0000 | INHALATION_SPRAY | Freq: Once | CUTANEOUS | Status: DC
  Filled 2012-08-30: qty 56

## 2012-08-30 MED ORDER — PROPOFOL INFUSION 10 MG/ML OPTIME
INTRAVENOUS | Status: DC | PRN
  Administered 2012-08-30: 70 ug/kg/min via INTRAVENOUS

## 2012-08-30 MED ORDER — MIDAZOLAM HCL 5 MG/5ML IJ SOLN
INTRAMUSCULAR | Status: DC | PRN
  Administered 2012-08-30 (×2): 2 mg via INTRAVENOUS

## 2012-08-30 MED ORDER — MIDAZOLAM HCL 2 MG/2ML IJ SOLN
1.0000 mg | INTRAMUSCULAR | Status: DC | PRN
  Administered 2012-08-30 (×2): 2 mg via INTRAVENOUS

## 2012-08-30 MED ORDER — PROPOFOL 10 MG/ML IV EMUL
INTRAVENOUS | Status: AC
  Filled 2012-08-30: qty 20

## 2012-08-30 MED ORDER — ONDANSETRON HCL 4 MG/2ML IJ SOLN
4.0000 mg | Freq: Once | INTRAMUSCULAR | Status: AC
  Administered 2012-08-30: 4 mg via INTRAVENOUS

## 2012-08-30 MED ORDER — LIDOCAINE HCL 1 % IJ SOLN
INTRAMUSCULAR | Status: DC | PRN
  Administered 2012-08-30: 50 mg via INTRADERMAL

## 2012-08-30 MED ORDER — LIDOCAINE HCL (PF) 1 % IJ SOLN
INTRAMUSCULAR | Status: AC
  Filled 2012-08-30: qty 5

## 2012-08-30 MED ORDER — STERILE WATER FOR IRRIGATION IR SOLN
Status: DC | PRN
  Administered 2012-08-30: 12:00:00

## 2012-08-30 MED ORDER — MIDAZOLAM HCL 2 MG/2ML IJ SOLN
INTRAMUSCULAR | Status: AC
  Filled 2012-08-30: qty 2

## 2012-08-30 MED ORDER — GLYCOPYRROLATE 0.2 MG/ML IJ SOLN
INTRAMUSCULAR | Status: AC
  Filled 2012-08-30: qty 1

## 2012-08-30 MED ORDER — FENTANYL CITRATE 0.05 MG/ML IJ SOLN: 25.0000 ug | INTRAMUSCULAR | Status: DC | PRN

## 2012-08-30 MED ORDER — LACTATED RINGERS IV SOLN
INTRAVENOUS | Status: DC
  Administered 2012-08-30: 11:00:00 via INTRAVENOUS

## 2012-08-30 MED ORDER — ONDANSETRON HCL 4 MG/2ML IJ SOLN: 4.0000 mg | Freq: Once | INTRAMUSCULAR | Status: DC | PRN

## 2012-08-30 MED ORDER — ONDANSETRON HCL 4 MG/2ML IJ SOLN
INTRAMUSCULAR | Status: AC
  Filled 2012-08-30: qty 2

## 2012-08-30 MED ORDER — WATER FOR IRRIGATION, STERILE IR SOLN
Status: DC | PRN
  Administered 2012-08-30: 1000 mL

## 2012-08-30 MED ORDER — MIDAZOLAM HCL 2 MG/2ML IJ SOLN
INTRAMUSCULAR | Status: AC
  Filled 2012-08-30: qty 4

## 2012-08-30 SURGICAL SUPPLY — 22 items
BLOCK BITE 60FR ADLT L/F BLUE (MISCELLANEOUS) ×2 IMPLANT
ELECT REM PT RETURN 9FT ADLT (ELECTROSURGICAL)
ELECTRODE REM PT RTRN 9FT ADLT (ELECTROSURGICAL) IMPLANT
FCP BXJMBJMB 240X2.8X (CUTTING FORCEPS)
FLOOR PAD 36X40 (MISCELLANEOUS) ×2
FORCEPS BIOP RAD 4 LRG CAP 4 (CUTTING FORCEPS) ×4 IMPLANT
FORCEPS BIOP RJ4 240 W/NDL (CUTTING FORCEPS)
FORCEPS BXJMBJMB 240X2.8X (CUTTING FORCEPS) IMPLANT
INJECTOR/SNARE I SNARE (MISCELLANEOUS) IMPLANT
LUBRICANT JELLY 4.5OZ STERILE (MISCELLANEOUS) ×2 IMPLANT
MANIFOLD NEPTUNE II (INSTRUMENTS) ×2 IMPLANT
NEEDLE SCLEROTHERAPY 25GX240 (NEEDLE) IMPLANT
PAD FLOOR 36X40 (MISCELLANEOUS) ×1 IMPLANT
PROBE APC STR FIRE (PROBE) IMPLANT
PROBE INJECTION GOLD (MISCELLANEOUS)
PROBE INJECTION GOLD 7FR (MISCELLANEOUS) IMPLANT
SNARE SHORT THROW 13M SML OVAL (MISCELLANEOUS) ×2 IMPLANT
SYR 50ML LL SCALE MARK (SYRINGE) ×2 IMPLANT
TRAP SPECIMEN MUCOUS 40CC (MISCELLANEOUS) IMPLANT
TUBING ENDO SMARTCAP PENTAX (MISCELLANEOUS) ×2 IMPLANT
TUBING IRRIGATION ENDOGATOR (MISCELLANEOUS) ×2 IMPLANT
WATER STERILE IRR 1000ML POUR (IV SOLUTION) ×4 IMPLANT

## 2012-08-30 NOTE — Transfer of Care (Signed)
Immediate Anesthesia Transfer of Care Note  Patient: Tracy Petersen  Procedure(s) Performed: Procedure(s) (LRB) with comments: COLONOSCOPY WITH PROPOFOL (N/A) - Incomplete Colonoscopy; end @1206  ESOPHAGOGASTRODUODENOSCOPY (EGD) WITH PROPOFOL (N/A) - start at 12:11 BIOPSY () - Random colon biopsies; Duodenal biopsies; Gastric Biopsies  Patient Location: PACU  Anesthesia Type:MAC  Level of Consciousness: awake and patient cooperative  Airway & Oxygen Therapy: Patient Spontanous Breathing and Patient connected to face mask oxygen  Post-op Assessment: Report given to PACU RN, Post -op Vital signs reviewed and stable and Patient moving all extremities  Post vital signs: Reviewed and stable  Complications: No apparent anesthesia complications

## 2012-08-30 NOTE — Anesthesia Preprocedure Evaluation (Addendum)
Anesthesia Evaluation  Patient identified by MRN, date of birth, ID band Patient awake    Reviewed: Allergy & Precautions, H&P , NPO status , Patient's Chart, lab work & pertinent test results  History of Anesthesia Complications Negative for: history of anesthetic complications  Airway Mallampati: II      Dental  (+) Teeth Intact   Pulmonary asthma ,  breath sounds clear to auscultation        Cardiovascular negative cardio ROS  Rhythm:Regular Rate:Normal     Neuro/Psych PSYCHIATRIC DISORDERS Depression Bipolar Disorder    GI/Hepatic GERD-  Medicated,  Endo/Other  Hypothyroidism   Renal/GU      Musculoskeletal   Abdominal   Peds  Hematology   Anesthesia Other Findings   Reproductive/Obstetrics                           Anesthesia Physical Anesthesia Plan  ASA: II  Anesthesia Plan: MAC   Post-op Pain Management:    Induction: Intravenous  Airway Management Planned: Simple Face Mask  Additional Equipment:   Intra-op Plan:   Post-operative Plan:   Informed Consent: I have reviewed the patients History and Physical, chart, labs and discussed the procedure including the risks, benefits and alternatives for the proposed anesthesia with the patient or authorized representative who has indicated his/her understanding and acceptance.     Plan Discussed with:   Anesthesia Plan Comments:         Anesthesia Quick Evaluation

## 2012-08-30 NOTE — H&P (Signed)
Primary Care Physician:  Sheila Oats, MD Primary Gastroenterologist:  Dr. Darrick Penna  Pre-Procedure History & Physical: HPI:  Tracy Petersen is a 21 y.o. female here for  VOMITING/DIARRHEA/ABDOMINAL PAIN.   Past Medical History  Diagnosis Date  . Borderline personality disorder   . Bipolar 1 disorder   . Acid reflux   . Hypothyroidism   . Depression with anxiety   . Asthma   . Fracture     fracture to rt. knee     Past Surgical History  Procedure Date  . Cholecystectomy 06/11/09    chronic cholecystitis  . Surgery as a baby     few months old, ?pyloric stenosis  . Liver biopsy 06/11/09    [place]-No cirrhosis or bridging fibrosis, minimal portal lymphocytic inflammation & mild microvesicular steatosis    Prior to Admission medications   Medication Sig Start Date End Date Taking? Authorizing Provider  acidophilus (RISAQUAD) CAPS Take 1 capsule by mouth daily.   Yes Historical Provider, MD  albuterol (PROVENTIL HFA;VENTOLIN HFA) 108 (90 BASE) MCG/ACT inhaler Inhale 1-2 puffs into the lungs every 6 (six) hours as needed for wheezing. 08/22/12  Yes Shelda Jakes, MD  clonazePAM (KLONOPIN) 0.5 MG tablet Take 0.5-1 mg by mouth at bedtime. Takes 0.5 mg (1 tab) in the morning and 2 tabs at bedtime   Yes Historical Provider, MD  FLUoxetine (PROZAC) 40 MG capsule Take 40 mg by mouth daily.   Yes Historical Provider, MD  iloperidone (FANAPT) 4 MG TABS Take 2 mg by mouth at bedtime.   Yes Historical Provider, MD  levothyroxine (SYNTHROID, LEVOTHROID) 25 MCG tablet Take 25 mcg by mouth daily.   Yes Historical Provider, MD  lithium carbonate 300 MG capsule Take 300-600 mg by mouth 2 (two) times daily. Takes 1 capsule (300 mg) in the morning and takes 2 capsules (600mg ) at bedtime   Yes Historical Provider, MD  naproxen (NAPROSYN) 500 MG tablet Take 1 tablet (500 mg total) by mouth 2 (two) times daily. 08/05/12  Yes Dione Booze, MD  Norgestim-Eth Charlott Holler Triphasic (TRINESSA, 28, PO)  Take 1 tablet by mouth daily.   Yes Historical Provider, MD  pantoprazole (PROTONIX) 40 MG tablet Take 1 tablet (40 mg total) by mouth daily. 08/11/12  Yes Joselyn Arrow, NP  promethazine (PHENERGAN) 25 MG tablet Take 25 mg by mouth every 6 (six) hours as needed. nausea   Yes Historical Provider, MD  HYDROcodone-ibuprofen (VICOPROFEN) 7.5-200 MG per tablet Take 1 tablet by mouth every 8 (eight) hours as needed. pain    Historical Provider, MD  Linaclotide (LINZESS) 290 MCG CAPS Take 290 mcg by mouth daily.    Historical Provider, MD  naproxen (NAPROSYN) 500 MG tablet Take 1 tablet (500 mg total) by mouth 2 (two) times daily. 08/22/12   Shelda Jakes, MD  traMADol (ULTRAM) 50 MG tablet Take 1 tablet (50 mg total) by mouth every 6 (six) hours as needed for pain. 08/05/12   Dione Booze, MD    Allergies as of 08/15/2012 - Review Complete 08/11/2012  Allergen Reaction Noted  . Penicillins  06/19/2012    Family History  Problem Relation Age of Onset  . Adopted: Yes  . Colon cancer Neg Hx     History   Social History  . Marital Status: Married    Spouse Name: N/A    Number of Children: N/A  . Years of Education: N/A   Occupational History  . disabled    Social History Main Topics  .  Smoking status: Never Smoker   . Smokeless tobacco: Not on file  . Alcohol Use: No  . Drug Use: No  . Sexually Active: Not on file   Other Topics Concern  . Not on file   Social History Narrative   Lives in [place] at [facility]    Review of Systems: See HPI, otherwise negative ROS   Physical Exam: BP 132/84  Pulse 101  Temp 97.9 F (36.6 C) (Oral)  Resp 28  SpO2 98%  LMP 08/26/2012 General:   Alert,  pleasant and cooperative in NAD Head:  Normocephalic and atraumatic. Neck:  Supple; Lungs:  Clear throughout to auscultation.    Heart:  Regular rate and rhythm. Abdomen:  Soft, nontender and nondistended. Normal bowel sounds, without guarding, and without rebound.     Neurologic:  Alert and  oriented x4;  grossly normal neurologically.  Impression/Plan:     NAUSEA/VOMTIING/DIARRHEA  PLAN: EGD/TCS TODAY

## 2012-08-30 NOTE — Anesthesia Postprocedure Evaluation (Signed)
  Anesthesia Post-op Note  Patient: Tracy Petersen  Procedure(s) Performed: Procedure(s) (LRB) with comments: COLONOSCOPY WITH PROPOFOL (N/A) - Incomplete Colonoscopy; end @1206  ESOPHAGOGASTRODUODENOSCOPY (EGD) WITH PROPOFOL (N/A) - start at 12:11 BIOPSY () - Random colon biopsies; Duodenal biopsies; Gastric Biopsies  Patient Location: PACU  Anesthesia Type:MAC  Level of Consciousness: awake, alert , oriented and patient cooperative  Airway and Oxygen Therapy: Patient Spontanous Breathing  Post-op Pain: none  Post-op Assessment: Post-op Vital signs reviewed, Patient's Cardiovascular Status Stable, Respiratory Function Stable, Patent Airway, No signs of Nausea or vomiting and Pain level controlled  Post-op Vital Signs: Reviewed and stable  Complications: No apparent anesthesia complications

## 2012-08-30 NOTE — OR Nursing (Signed)
Pt. Wearing brace to  Rt. Knee area, due to fractured  Knee area.

## 2012-08-31 ENCOUNTER — Telehealth: Payer: Self-pay | Admitting: Gastroenterology

## 2012-08-31 NOTE — Telephone Encounter (Signed)
Message copied by Glendora Score on Wed Aug 31, 2012  3:07 PM ------      Message from: [place]      Created: Tue Aug 30, 2012 12:30 PM       PT NEEDS TCS IN 1-2 MOS AT [facility] WITH PROPOFOL DUE TO POLYPHARMACY AND OVERTUBE, DX: DIARRHEA AND ABDOMINAL PAIN. PT HAD AN INCOMPLETE TCS DUE TO REDUNDANT SIGMOID AND TRANSVERSE COLON.

## 2012-08-31 NOTE — Telephone Encounter (Signed)
Referral has been sent to [facility] and they will contact the patient with date and time

## 2012-09-01 ENCOUNTER — Telehealth: Payer: Self-pay | Admitting: *Deleted

## 2012-09-01 ENCOUNTER — Telehealth: Payer: Self-pay | Admitting: Gastroenterology

## 2012-09-01 ENCOUNTER — Encounter (HOSPITAL_COMMUNITY): Payer: Self-pay | Admitting: Gastroenterology

## 2012-09-01 NOTE — Telephone Encounter (Signed)
I called Tracy Petersen today to change her appt from Jan to April and she expressed to me that she does not want to go for a colonoscopy at  [facility] and that she is feeling better. I told her I would pass this information along to Dr Darrick Penna' nurse. Thanks.

## 2012-09-01 NOTE — Telephone Encounter (Signed)
Routing to Dr. Darrick Penna and Lorenza Burton, NP.

## 2012-09-01 NOTE — Telephone Encounter (Signed)
PLEASE CALL PT.  SHE MAY ELECT TO NOT GO TO [facility], BUT DR. Alliya Marcon RECOMMENDS THAT SHE HAVE A COMPLETE COLONOSCOPY BECAUSE SHE ONLY SAW ~80% OF HER COLON. WE CAN DISCUSS IT AGAIN IN APR 2014.

## 2012-09-01 NOTE — Telephone Encounter (Signed)
PLEASE CALL PT. HER STOMACH BIIOPSIES SHOWED MILD GASTRITIS. HER SMALL BOWEL & COLON BIOPSIES ARE NORMAL. 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  DR. Anabel Bene WAS UNABLE TO COMPLETE YOUR COLONOSCOPY. SHE SAW 80% OF YOUR COLON.    CONTINUE PROTONIX.  DRINK WATER TO KEEP URINE LIGHT YELLOW.  FOLLOW A HIGH FIBER/low fat DIET. AVOID ITEMS THAT CAUSE BLOATING.   USE PREPERATION H 2 TO 4 TIMES A DAY AS NEEDED FOR RECTAL PRESSURE/PAIN/BLEEDING.  Next colonoscopy in IN 1-2 MOS AT [facility].  FOLLOW UP IN APR 2014 E 30 KJ.

## 2012-09-01 NOTE — Telephone Encounter (Signed)
Path faxed to PCP, appt made for April 2014

## 2012-09-01 NOTE — Telephone Encounter (Signed)
Called and asked to speak to pt, no one ever came to the phone, a lot of noise and talking in the background.

## 2012-09-01 NOTE — Telephone Encounter (Signed)
Referral has been faxed to [facility]  

## 2012-09-02 NOTE — Op Note (Signed)
[facility] [address] [place], [address]   COLONOSCOPY PROCEDURE REPORT  PATIENT: Tracy, Petersen  MR#: [id number] BIRTHDATE:    ,     yrs. old GENDER: ENDOSCOPIST: Jonette Eva, MD REFERRED XB:JYNW Loney Hering, M.D. PROCEDURE DATE:  08/30/2012 PROCEDURE:   INCOMPLETE Colonoscopy, screening INDICATIONS:abd pain, vomiting, and diarrhea. MEDICATIONS: MAC sedation, administered by CRNA  DESCRIPTION OF PROCEDURE:    Physical exam was performed.  Informed consent was obtained from the patient after explaining the benefits, risks, and alternatives to procedure.  The patient was connected to monitor and placed in left lateral position. Continuous oxygen was provided by nasal cannula and IV medicine administered through an indwelling cannula.  After administration of sedation and rectal exam, the patients rectum was intubated and the     colonoscope was advanced under direct visualization to the cecum.  The scope was removed slowly by carefully examining the color, texture, anatomy, and integrity mucosa on the way out.  The patient was recovered in endoscopy and discharged home in satisfactory condition.       COLON FINDINGS: Tortuous redundant sigmoid and transverse colon. UNABLE TO ADVANCE SCOPE INTO THE PROXIMAL AC AND CECUM IN SPITE OF CHANGING POSITION AND APPLYING PRESSURE, The colon was otherwise normal.  There was no diverticulosis, inflammation, polyps or cancers unless previously stated.  , and Small internal hemorrhoids were found.  PREP QUALITY: good. CECAL W/D TIME:  COMPLICATIONS: None  ENDOSCOPIC IMPRESSION: 1.   Tortuous redundant sigmoid and transverse colon.  UNABLE TO ADVANCE SCOPE INTO THE PROXIMAL AC AND CECUM 2.   The colon was otherwise normal 3.   Small internal hemorrhoids   RECOMMENDATIONS: DRINK WATER TO KEEP URINE LIGHT YELLOW.  FOLLOW A HIGH FIBER/low fat DIET.  AVOID ITEMS THAT CAUSE BLOATING.   USE Preparation H 2 TO 4  TIMES A DAY AS NEEDED FOR RECTAL PRESSURE/PAIN/BLEEDING.  Next colonoscopy in IN 1-2 MOS AT [facility] WITH PROPOFOL/OVERTUBE/FLUROSCOPY.  OPV APR 2014       _______________________________ Rosalie DoctorJonette Eva, MD 09/02/2012 10:09 AM     PATIENT NAME:  Tracy, Petersen MR#: [id number]

## 2012-09-03 NOTE — Progress Notes (Signed)
REVIEWED.  

## 2012-09-05 NOTE — Telephone Encounter (Signed)
I called and informed pt and caregiver, Larita Fife. Pt said she does not want to go to [facility]. She said she went to the ED yesterday at [facility] for abdominal pain and rectal bleeding and they did not do anything for her. But she also said that she talked to the physician there and he does colonoscopies and he told her that he did not think it was necessary to do another colonoscopy just because only 80% of the colon was seen. She said that he said he felt that her problems were coming from her stomach and not her colon. I told her that Dr. Darrick Penna IS RECOMMENDING THAT SHE HAVE THE COLONOSCOPY AT [facility], IT IS IMPORTANT TO SEE ALL OF THE COLON TO COMPLETELY EVALUATE.

## 2012-09-05 NOTE — Telephone Encounter (Signed)
REVIEWED.  

## 2012-09-05 NOTE — Telephone Encounter (Signed)
REVIEWED. AGREE. 

## 2012-09-05 NOTE — Telephone Encounter (Signed)
I called and informed pt that Dr. Darrick Penna DOES WANT HER TO HAVE THE COLONOSCOPY AS RECOMMENDED.  Please see phone note of 09/01/2012.

## 2012-11-03 ENCOUNTER — Ambulatory Visit: Payer: Medicaid Other | Admitting: Gastroenterology

## 2013-01-26 ENCOUNTER — Encounter: Payer: Self-pay | Admitting: Gastroenterology

## 2013-01-31 ENCOUNTER — Telehealth: Payer: Self-pay | Admitting: Gastroenterology

## 2013-01-31 ENCOUNTER — Ambulatory Visit: Payer: Medicaid Other | Admitting: Gastroenterology

## 2013-01-31 ENCOUNTER — Ambulatory Visit: Payer: Self-pay | Admitting: Urgent Care

## 2013-01-31 NOTE — Telephone Encounter (Signed)
Pt was a no show

## 2013-02-20 NOTE — Telephone Encounter (Signed)
Please send letter for f/u.  

## 2013-02-21 ENCOUNTER — Encounter: Payer: Self-pay | Admitting: General Practice

## 2013-02-21 NOTE — Telephone Encounter (Signed)
LETTER MAILED

## 2014-10-19 ENCOUNTER — Emergency Department: Payer: Self-pay | Admitting: Internal Medicine

## 2015-01-15 DIAGNOSIS — F419 Anxiety disorder, unspecified: Secondary | ICD-10-CM | POA: Insufficient documentation

## 2015-01-15 DIAGNOSIS — E039 Hypothyroidism, unspecified: Secondary | ICD-10-CM | POA: Insufficient documentation

## 2015-01-15 DIAGNOSIS — J4520 Mild intermittent asthma, uncomplicated: Secondary | ICD-10-CM | POA: Insufficient documentation

## 2015-04-16 DIAGNOSIS — R45851 Suicidal ideations: Secondary | ICD-10-CM | POA: Insufficient documentation

## 2015-04-16 DIAGNOSIS — F329 Major depressive disorder, single episode, unspecified: Secondary | ICD-10-CM | POA: Insufficient documentation

## 2015-04-16 DIAGNOSIS — F4310 Post-traumatic stress disorder, unspecified: Secondary | ICD-10-CM | POA: Insufficient documentation

## 2015-04-16 DIAGNOSIS — F603 Borderline personality disorder: Secondary | ICD-10-CM | POA: Insufficient documentation

## 2015-08-06 DIAGNOSIS — B029 Zoster without complications: Secondary | ICD-10-CM | POA: Insufficient documentation

## 2015-09-14 ENCOUNTER — Emergency Department
Admission: EM | Admit: 2015-09-14 | Discharge: 2015-09-14 | Disposition: A | Discharge status: Home / Self Care | Payer: Medicaid Other | Attending: Emergency Medicine | Admitting: Emergency Medicine

## 2015-09-14 ENCOUNTER — Encounter: Payer: Self-pay | Admitting: Emergency Medicine

## 2015-09-14 ENCOUNTER — Emergency Department: Payer: Medicaid Other

## 2015-09-14 DIAGNOSIS — Z79899 Other long term (current) drug therapy: Secondary | ICD-10-CM | POA: Insufficient documentation

## 2015-09-14 DIAGNOSIS — Z880 Allergy status to penicillin: Secondary | ICD-10-CM | POA: Insufficient documentation

## 2015-09-14 DIAGNOSIS — Z793 Long term (current) use of hormonal contraceptives: Secondary | ICD-10-CM | POA: Diagnosis not present

## 2015-09-14 DIAGNOSIS — M79601 Pain in right arm: Secondary | ICD-10-CM

## 2015-09-14 MED ORDER — BACLOFEN 10 MG PO TABS
10.0000 mg | ORAL_TABLET | Freq: Three times a day (TID) | ORAL | Status: AC
Start: 2015-09-14 — End: ?

## 2015-09-14 NOTE — ED Notes (Signed)
Pt to ed via ems with c/o right arm pain since yesterday, denies injury,.

## 2015-09-14 NOTE — Discharge Instructions (Signed)
Heat Therapy Heat therapy can help ease sore, stiff, injured, and tight muscles and joints. Heat relaxes your muscles, which may help ease your pain.  RISKS AND COMPLICATIONS If you have any of the following conditions, do not use heat therapy unless your health care provider has approved:  Poor circulation.  Healing wounds or scarred skin in the area being treated.  Diabetes, heart disease, or high blood pressure.  Not being able to feel (numbness) the area being treated.  Unusual swelling of the area being treated.  Active infections.  Blood clots.  Cancer.  Inability to communicate pain. This may include young children and people who have problems with their brain function (dementia).  Pregnancy. Heat therapy should only be used on old, pre-existing, or long-lasting (chronic) injuries. Do not use heat therapy on new injuries unless directed by your health care provider. HOW TO USE HEAT THERAPY There are several different kinds of heat therapy, including:  Moist heat pack.  Warm water bath.  Hot water bottle.  Electric heating pad.  Heated gel pack.  Heated wrap.  Electric heating pad. Use the heat therapy method suggested by your health care provider. Follow your health care provider's instructions on when and how to use heat therapy. GENERAL HEAT THERAPY RECOMMENDATIONS  Do not sleep while using heat therapy. Only use heat therapy while you are awake.  Your skin may turn pink while using heat therapy. Do not use heat therapy if your skin turns red.  Do not use heat therapy if you have new pain.  High heat or long exposure to heat can cause burns. Be careful when using heat therapy to avoid burning your skin.  Do not use heat therapy on areas of your skin that are already irritated, such as with a rash or sunburn. SEEK MEDICAL CARE IF:  You have blisters, redness, swelling, or numbness.  You have new pain.  Your pain is worse. MAKE SURE  YOU:  Understand these instructions.  Will watch your condition.  Will get help right away if you are not doing well or get worse.   This information is not intended to replace advice given to you by your health care provider. Make sure you discuss any questions you have with your health care provider.   Document Released: 01/04/2012 Document Revised: 11/02/2014 Document Reviewed: 12/05/2013 Elsevier Interactive Patient Education 2016 [employer]  Musculoskeletal Pain Musculoskeletal pain is muscle and boney aches and pains. These pains can occur in any part of the body. Your caregiver may treat you without knowing the cause of the pain. They may treat you if blood or urine tests, X-rays, and other tests were normal.  CAUSES There is often not a definite cause or reason for these pains. These pains may be caused by a type of germ (virus). The discomfort may also come from overuse. Overuse includes working out too hard when your body is not fit. Boney aches also come from weather changes. Bone is sensitive to atmospheric pressure changes. HOME CARE INSTRUCTIONS   Ask when your test results will be ready. Make sure you get your test results.  Only take over-the-counter or prescription medicines for pain, discomfort, or fever as directed by your caregiver. If you were given medications for your condition, do not drive, operate machinery or power tools, or sign legal documents for 24 hours. Do not drink alcohol. Do not take sleeping pills or other medications that may interfere with treatment.  Continue all activities unless the activities cause  more pain. When the pain lessens, slowly resume normal activities. Gradually increase the intensity and duration of the activities or exercise.  During periods of severe pain, bed rest may be helpful. Lay or sit in any position that is comfortable.  Putting ice on the injured area.  Put ice in a bag.  Place a towel between your skin and the  bag.  Leave the ice on for 15 to 20 minutes, 3 to 4 times a day.  Follow up with your caregiver for continued problems and no reason can be found for the pain. If the pain becomes worse or does not go away, it may be necessary to repeat tests or do additional testing. Your caregiver may need to look further for a possible cause. SEEK IMMEDIATE MEDICAL CARE IF:  You have pain that is getting worse and is not relieved by medications.  You develop chest pain that is associated with shortness or breath, sweating, feeling sick to your stomach (nauseous), or throw up (vomit).  Your pain becomes localized to the abdomen.  You develop any new symptoms that seem different or that concern you. MAKE SURE YOU:   Understand these instructions.  Will watch your condition.  Will get help right away if you are not doing well or get worse.   This information is not intended to replace advice given to you by your health care provider. Make sure you discuss any questions you have with your health care provider.   Document Released: 10/12/2005 Document Revised: 01/04/2012 Document Reviewed: 06/16/2013 Elsevier Interactive Patient Education 2016 [employer]  Pain Without a Known Cause WHAT IS PAIN WITHOUT A KNOWN CAUSE? Pain can occur in any part of the body and can range from mild to severe. Sometimes no cause can be found for why you are having pain. Some types of pain that can occur without a known cause include:   Headache.  Back pain.  Abdominal pain.  Neck pain. HOW IS PAIN WITHOUT A KNOWN CAUSE DIAGNOSED?  Your health care provider will try to find the cause of your pain. This may include:  Physical exam.  Medical history.  Blood tests.  Urine tests.  X-rays. If no cause is found, your health care provider may diagnose you with pain without a known cause.  IS THERE TREATMENT FOR PAIN WITHOUT A CAUSE?  Treatment depends on the kind of pain you have. Your health care provider  may prescribe medicines to help relieve your pain.  WHAT CAN I DO AT HOME FOR MY PAIN?   Take medicines only as directed by your health care provider.  Stop any activities that cause pain. During periods of severe pain, bed rest may help.  Try to reduce your stress with activities such as yoga or meditation. Talk to your health care provider for other stress-reducing activity recommendations.  Exercise regularly, if approved by your health care provider.  Eat a healthy diet that includes fruits and vegetables. This may improve pain. Talk to your health care provider if you have any questions about your diet. WHAT IF MY PAIN DOES NOT GET BETTER?  If you have a painful condition and no reason can be found for the pain or the pain gets worse, it is important to follow up with your health care provider. It may be necessary to repeat tests and look further for a possible cause.    This information is not intended to replace advice given to you by your health care provider. Make sure  you discuss any questions you have with your health care provider.   Document Released: 07/07/2001 Document Revised: 11/02/2014 Document Reviewed: 02/27/2014 Elsevier Interactive Patient Education [employer]

## 2015-09-14 NOTE — ED Provider Notes (Signed)
[facility] Emergency Department Provider Note  ____________________________________________  Time seen: Approximately 9:40 AM  I have reviewed the triage vital signs and the nursing notes.   HISTORY  Chief Complaint Arm Pain    HPI Tracy Petersen is a 24 y.o. female presents to the emergency room via EMS with complaints of right arm pain times yesterday. Patient states that she went to person and hospital and was given a steroid shot and told everything was fine. Patient presents today for second pain of her continuous right arm pain. Desires x-rays.   Past Medical History  Diagnosis Date  . Borderline personality disorder   . Bipolar 1 disorder (HCC)   . Acid reflux   . Hypothyroidism   . Depression with anxiety   . Asthma   . Fracture     fracture to rt. knee     Patient Active Problem List   Diagnosis Date Noted  . Nausea 08/11/2012  . Diarrhea 08/11/2012  . Constipation 07/31/2012  . Abdominal pain 06/30/2012  . Alternating constipation and diarrhea 06/30/2012    Past Surgical History  Procedure Laterality Date  . Cholecystectomy  06/11/09    chronic cholecystitis  . Surgery as a baby      few months old, ?pyloric stenosis  . Liver biopsy  06/11/09    [place]-No cirrhosis or bridging fibrosis, minimal portal lymphocytic inflammation & mild microvesicular steatosis  . Colonoscopy with propofol  08/30/2012    [id number] internal hemorrhoids/Tortuous redundant sigmoid and transverse colon  . Esophagogastroduodenoscopy (egd) with propofol  08/30/2012    Procedure: ESOPHAGOGASTRODUODENOSCOPY (EGD) WITH PROPOFOL;  Surgeon: West Bali, MD;  Location: [facility];  Service: Endoscopy;  Laterality: N/A;  start at 12:11  . Esophageal biopsy  08/30/2012    Procedure: BIOPSY;  Surgeon: West Bali, MD;  Location: [facility];  Service: Endoscopy;  Laterality: N/A;  Random colon biopsies; Duodenal biopsies; Gastric Biopsies    Current Outpatient Rx   Name  Route  Sig  Dispense  Refill  . acidophilus (RISAQUAD) CAPS   Oral   Take 1 capsule by mouth daily.         Marland Kitchen albuterol (PROVENTIL HFA;VENTOLIN HFA) 108 (90 BASE) MCG/ACT inhaler   Inhalation   Inhale 1-2 puffs into the lungs every 6 (six) hours as needed for wheezing.   1 Inhaler   0   . baclofen (LIORESAL) 10 MG tablet   Oral   Take 1 tablet (10 mg total) by mouth 3 (three) times daily.   30 tablet   0   . clonazePAM (KLONOPIN) 0.5 MG tablet   Oral   Take 0.5-1 mg by mouth at bedtime. Takes 0.5 mg (1 tab) in the morning and 2 tabs at bedtime         . FLUoxetine (PROZAC) 40 MG capsule   Oral   Take 40 mg by mouth daily.         Marland Kitchen HYDROcodone-ibuprofen (VICOPROFEN) 7.5-200 MG per tablet   Oral   Take 1 tablet by mouth every 8 (eight) hours as needed. pain         . iloperidone (FANAPT) 4 MG TABS   Oral   Take 2 mg by mouth at bedtime.         Marland Kitchen levothyroxine (SYNTHROID, LEVOTHROID) 25 MCG tablet   Oral   Take 25 mcg by mouth daily.         Marland Kitchen Linaclotide (LINZESS) 290 MCG CAPS   Oral  Take 290 mcg by mouth daily.         Marland Kitchen. lithium carbonate 300 MG capsule   Oral   Take 300-600 mg by mouth 2 (two) times daily. Takes 1 capsule (300 mg) in the morning and takes 2 capsules (600mg ) at bedtime         . Norgestim-Eth Estrad Triphasic (TRINESSA, 28, PO)   Oral   Take 1 tablet by mouth daily.         . pantoprazole (PROTONIX) 40 MG tablet   Oral   Take 1 tablet (40 mg total) by mouth daily.   30 tablet   5   . promethazine (PHENERGAN) 25 MG tablet   Oral   Take 25 mg by mouth every 6 (six) hours as needed. nausea           Allergies Penicillins  Family History  Problem Relation Age of Onset  . Adopted: Yes  . Colon cancer Neg Hx     Social History Social History  Substance Use Topics  . Smoking status: Never Smoker   . Smokeless tobacco: None  . Alcohol Use: No    Review of Systems Constitutional: No  fever/chills Eyes: No visual changes. ENT: No sore throat. Cardiovascular: Denies chest pain. Respiratory: Denies shortness of breath. Gastrointestinal: No abdominal pain.  No nausea, no vomiting.  No diarrhea.  No constipation. Genitourinary: Negative for dysuria. Musculoskeletal: Positive for right arm pain and right shoulder to the right hand. Skin: Negative for rash. Neurological: Negative for headaches, focal weakness or numbness.  10-point ROS otherwise negative.  ____________________________________________   PHYSICAL EXAM:  VITAL SIGNS: ED Triage Vitals  Enc Vitals Group     BP 09/14/15 0930 132/88 mmHg     Pulse Rate 09/14/15 0930 90     Resp 09/14/15 0930 18     Temp 09/14/15 0930 98.4 F (36.9 C)     Temp Source 09/14/15 0930 Oral     SpO2 09/14/15 0930 96 %     Weight 09/14/15 0930 250 lb (113.399 kg)     Height 09/14/15 0930 5\' 6"  (1.676 m)     Head Cir --      Peak Flow --      Pain Score 09/14/15 0926 8     Pain Loc --      Pain Edu? --      Excl. in GC? --     Constitutional: Alert and oriented. Well appearing and in no acute distress. Eyes: Conjunctivae are normal. PERRL. EOMI. Head: Atraumatic. Nose: No congestion/rhinnorhea. Mouth/Throat: Mucous membranes are moist.  Oropharynx non-erythematous. Neck: No stridor.   Cardiovascular: Normal rate, regular rhythm. Grossly normal heart sounds.  Good peripheral circulation. Respiratory: Normal respiratory effort.  No retractions. Lungs CTAB. Musculoskeletal: Right arm with limited range of motion distally neurovascularly intact. Patient has increased pain with any type of movement or arm flexion and extension and pronation and supination finger to thumb touching. Neurologic:  Normal speech and language. No gross focal neurologic deficits are appreciated. No gait instability. Skin:  Skin is warm, dry and intact. No rash noted. Psychiatric: Mood and affect are normal. Speech and behavior are  normal.  ____________________________________________   LABS (all labs ordered are listed, but only abnormal results are displayed)  Labs Reviewed - No data to display   RADIOLOGY  No acute fracture dislocations or bony process going on. ____________________________________________   PROCEDURES  Procedure(s) performed: None  Critical Care performed: No  ____________________________________________  INITIAL IMPRESSION / ASSESSMENT AND PLAN / ED COURSE  Pertinent labs & imaging results that were available during my care of the patient were reviewed by me and considered in my medical decision making (see chart for details).  No radiological findings to support fracture or dislocation. No evidence of injury other than patient's subjective pain. Reassurance provided to the patient. Patient encouraged to follow up with PCP or return to the ER with any new or developing symptomology. ____________________________________________   FINAL CLINICAL IMPRESSION(S) / ED DIAGNOSES  Final diagnoses:  Pain of right upper extremity      Evangeline Dakin, PA-C 09/14/15 1221  Myrna Blazer, MD 09/14/15 [phone]

## 2015-09-14 NOTE — ED Notes (Signed)
NAD noted at this time. Pt ambulatory to the lobby. PT denies questions/concerns at this time.

## 2015-09-20 ENCOUNTER — Emergency Department
Admission: EM | Admit: 2015-09-20 | Discharge: 2015-09-21 | Disposition: A | Discharge status: Other Acute Inpatient Hospital | Payer: Medicaid Other | Attending: Emergency Medicine | Admitting: Emergency Medicine

## 2015-09-20 DIAGNOSIS — F419 Anxiety disorder, unspecified: Secondary | ICD-10-CM | POA: Insufficient documentation

## 2015-09-20 DIAGNOSIS — Z79899 Other long term (current) drug therapy: Secondary | ICD-10-CM | POA: Insufficient documentation

## 2015-09-20 DIAGNOSIS — F329 Major depressive disorder, single episode, unspecified: Secondary | ICD-10-CM

## 2015-09-20 DIAGNOSIS — Z880 Allergy status to penicillin: Secondary | ICD-10-CM | POA: Insufficient documentation

## 2015-09-20 DIAGNOSIS — F32A Depression, unspecified: Secondary | ICD-10-CM

## 2015-09-20 DIAGNOSIS — R45851 Suicidal ideations: Secondary | ICD-10-CM | POA: Diagnosis present

## 2015-09-20 LAB — COMPREHENSIVE METABOLIC PANEL
ALBUMIN: 3.9 g/dL (ref 3.5–5.0)
ALT: 24 U/L (ref 14–54)
AST: 21 U/L (ref 15–41)
Alkaline Phosphatase: 75 U/L (ref 38–126)
Anion gap: 5 (ref 5–15)
BILIRUBIN TOTAL: 0.5 mg/dL (ref 0.3–1.2)
BUN: <5 mg/dL — ABNORMAL LOW (ref 6–20)
CALCIUM: 9.8 mg/dL (ref 8.9–10.3)
CHLORIDE: 111 mmol/L (ref 101–111)
CO2: 24 mmol/L (ref 22–32)
Creatinine, Ser: 0.85 mg/dL (ref 0.44–1.00)
GFR calc Af Amer: >60 mL/min (ref >60)
GLUCOSE: 89 mg/dL (ref 65–99)
Potassium: 3.7 mmol/L (ref 3.5–5.1)
Sodium: 140 mmol/L (ref 135–145)
TOTAL PROTEIN: 7.8 g/dL (ref 6.5–8.1)

## 2015-09-20 LAB — URINE DRUG SCREEN, QUALITATIVE (ARMC ONLY)
Amphetamines, Ur Screen: NOT DETECTED
BARBITURATES, UR SCREEN: NOT DETECTED
BENZODIAZEPINE, UR SCRN: NOT DETECTED
Cannabinoid 50 Ng, Ur ~~LOC~~: NOT DETECTED
Cocaine Metabolite,Ur ~~LOC~~: NOT DETECTED
MDMA (Ecstasy)Ur Screen: NOT DETECTED
METHADONE SCREEN, URINE: NOT DETECTED
OPIATE, UR SCREEN: POSITIVE — AB
Phencyclidine (PCP) Ur S: NOT DETECTED
TRICYCLIC, UR SCREEN: NOT DETECTED

## 2015-09-20 LAB — CBC
HEMATOCRIT: 38.9 % (ref 35.0–47.0)
Hemoglobin: 12.7 g/dL (ref 12.0–16.0)
MCH: 28.9 pg (ref 26.0–34.0)
MCHC: 32.7 g/dL (ref 32.0–36.0)
MCV: 88.2 fL (ref 80.0–100.0)
Platelets: 284 10*3/uL (ref 150–440)
RBC: 4.41 MIL/uL (ref 3.80–5.20)
RDW: 14.3 % (ref 11.5–14.5)
WBC: 10.2 10*3/uL (ref 3.6–11.0)

## 2015-09-20 LAB — SALICYLATE LEVEL

## 2015-09-20 LAB — ETHANOL: Alcohol, Ethyl (B): <5 mg/dL (ref <5)

## 2015-09-20 LAB — ACETAMINOPHEN LEVEL

## 2015-09-20 MED ORDER — GABAPENTIN 600 MG PO TABS
600.0000 mg | ORAL_TABLET | Freq: Every day | ORAL | Status: DC
  Administered 2015-09-20: 600 mg via ORAL
  Filled 2015-09-20: qty 1

## 2015-09-20 MED ORDER — CLONAZEPAM 1 MG PO TABS
1.0000 mg | ORAL_TABLET | Freq: Every day | ORAL | Status: DC
  Administered 2015-09-20: 1 mg via ORAL
  Filled 2015-09-20: qty 1

## 2015-09-20 MED ORDER — TRAZODONE HCL 50 MG PO TABS
25.0000 mg | ORAL_TABLET | Freq: Every day | ORAL | Status: DC
  Administered 2015-09-20: 25 mg via ORAL
  Filled 2015-09-20: qty 1

## 2015-09-20 MED ORDER — CLONAZEPAM 0.5 MG PO TABS
0.5000 mg | ORAL_TABLET | ORAL | Status: DC
  Administered 2015-09-21: 0.5 mg via ORAL
  Filled 2015-09-20: qty 1

## 2015-09-20 MED ORDER — LITHIUM CARBONATE 300 MG PO CAPS
1200.0000 mg | ORAL_CAPSULE | Freq: Every day | ORAL | Status: DC
  Administered 2015-09-20: 1200 mg via ORAL
  Filled 2015-09-20: qty 4

## 2015-09-20 MED ORDER — FLUOXETINE HCL 20 MG PO CAPS
40.0000 mg | ORAL_CAPSULE | Freq: Every day | ORAL | Status: DC
  Administered 2015-09-20: 40 mg via ORAL
  Filled 2015-09-20: qty 2

## 2015-09-20 NOTE — ED Notes (Addendum)
Pt transferred from main ed ,pt came from group home due to her c/o about sucidal ideations has no plan she states that she is upset about the holidays and has a poor relationship with her family she states I feel "I would be better off dead" she denies medical issues except thyroid and some chronic abd pain ,pt oriented to unit and has no questions or requests at this time.

## 2015-09-20 NOTE — ED Notes (Signed)
Patient asleep in room. No noted distress or abnormal behavior. Will continue 15 minute checks and observation by security cameras for safety. 

## 2015-09-20 NOTE — ED Notes (Signed)
Snack provided

## 2015-09-20 NOTE — ED Notes (Signed)
Patient currently expresses continued abdominal discomfort but states that this is normal due to, "My IBS and stuff like that". Patient states that she was on Protonix 40 mg but that this medication has since been discontinued. Will continue to monitor for pain. Maintained on 15 minute checks and observation by security camera for safety.

## 2015-09-20 NOTE — ED Notes (Signed)

## 2015-09-20 NOTE — ED Notes (Signed)
Patient is sitting on her bed watching TV in no acute distress.  No abnormal behaviors noted.  Will continue to monitor with security cameras and q.15 minute safety checks.

## 2015-09-20 NOTE — ED Notes (Signed)
Patient currently in bed watching television. Patient complains of abdominal discomfort with pain rated a 5/10. Patient requested sprite to relieve discomfort and nurse administered it. Will continue to monitor and re-assess pain. Maintained on 15 minute checks and observation by security camera for safety.

## 2015-09-20 NOTE — ED Notes (Signed)
Patient currently in the room watching television. Has no complaints at this time. Maintained on 15 minute checks and observation by security camera for safety.
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
## 2015-09-20 NOTE — ED Provider Notes (Signed)
[facility] Emergency Department Provider Note   ____________________________________________  Time seen:  I have reviewed the triage vital signs and the triage nursing note.  HISTORY  Chief Complaint Suicidal; Depression; and Anxiety   Historian Patient  HPI Tracy Petersen is a 24 y.o. female with a history of bipolar, depression, and prior suicidal ideation, is here for evaluation of depression and suicidal ideation. Patient states she lives at a group home. She states that for the last 3 days she has developed worsening depression, without any inciting factor. No new stressors. No change in medication doses. She states she has heard some voices, reportedly female, telling her to hurt herself. No particular plan. She has been feeling like she doesn't need to live anymore. Here in the emergency department she is somewhat hopeful for help and is not actively suicidal in the ED.    Past Medical History  Diagnosis Date  . Borderline personality disorder   . Bipolar 1 disorder (HCC)   . Acid reflux   . Hypothyroidism   . Depression with anxiety   . Asthma   . Fracture     fracture to rt. knee     Patient Active Problem List   Diagnosis Date Noted  . Nausea 08/11/2012  . Diarrhea 08/11/2012  . Constipation 07/31/2012  . Abdominal pain 06/30/2012  . Alternating constipation and diarrhea 06/30/2012    Past Surgical History  Procedure Laterality Date  . Cholecystectomy  06/11/09    chronic cholecystitis  . Surgery as a baby      few months old, ?pyloric stenosis  . Liver biopsy  06/11/09    [place]-No cirrhosis or bridging fibrosis, minimal portal lymphocytic inflammation & mild microvesicular steatosis  . Colonoscopy with propofol  08/30/2012    [facility]:[id number]:Small internal hemorrhoids/Tortuous redundant sigmoid and transverse colon  . Esophagogastroduodenoscopy (egd) with propofol  08/30/2012    Procedure: ESOPHAGOGASTRODUODENOSCOPY (EGD) WITH PROPOFOL;   Surgeon: West BaliSandi L Fields, MD;  Location: [facility];  Service: Endoscopy;  Laterality: N/A;  start at 12:11  . Esophageal biopsy  08/30/2012    Procedure: BIOPSY;  Surgeon: West BaliSandi L Fields, MD;  Location: [facility];  Service: Endoscopy;  Laterality: N/A;  Random colon biopsies; Duodenal biopsies; Gastric Biopsies    Current Outpatient Rx  Name  Route  Sig  Dispense  Refill  . albuterol (PROVENTIL HFA;VENTOLIN HFA) 108 (90 BASE) MCG/ACT inhaler   Inhalation   Inhale 1-2 puffs into the lungs every 6 (six) hours as needed for wheezing.   1 Inhaler   0   . clonazePAM (KLONOPIN) 0.5 MG tablet   Oral   Take 0.5-1 mg by mouth at bedtime. Takes 0.5 mg (1 tab) in the morning and 2 tabs at bedtime         . cyanocobalamin 100 MCG tablet   Oral   Take 500 mcg by mouth daily.         Marland Kitchen. FLUoxetine (PROZAC) 40 MG capsule   Oral   Take 40 mg by mouth daily.         Marland Kitchen. gabapentin (NEURONTIN) 300 MG capsule   Oral   Take 300 mg by mouth at bedtime.         Marland Kitchen. levothyroxine (SYNTHROID, LEVOTHROID) 25 MCG tablet   Oral   Take 75 mcg by mouth daily.          Marland Kitchen. lithium carbonate 300 MG capsule   Oral   Take 300-600 mg by mouth 2 (two)  times daily. Takes 1 capsule (300 mg) in the morning and takes 2 capsules ( ) at bedtime         . pantoprazole (PROTONIX) 40 MG tablet   Oral   Take 1 tablet (40 mg total) by mouth daily.   30 tablet   5   . traZODone (DESYREL) 50 MG tablet   Oral   Take 25 mg by mouth at bedtime.         Marland Kitchen acidophilus (RISAQUAD) CAPS   Oral   Take 1 capsule by mouth daily.         . baclofen (LIORESAL) 10 MG tablet   Oral   Take 1 tablet (10 mg total) by mouth 3 (three) times daily.   30 tablet   0   . HYDROcodone-ibuprofen (VICOPROFEN) 7.5-200 MG per tablet   Oral   Take 1 tablet by mouth every 8 (eight) hours as needed. pain         . iloperidone (FANAPT) 4 MG TABS   Oral   Take 2 mg by mouth at bedtime.         . Linaclotide (LINZESS) 290 MCG  CAPS   Oral   Take 290 mcg by mouth daily.         Lorita Officer Triphasic (TRINESSA, 28, PO)   Oral   Take 1 tablet by mouth daily.         . promethazine (PHENERGAN) 25 MG tablet   Oral   Take 25 mg by mouth every 6 (six) hours as needed. nausea           Allergies Penicillins  Family History  Problem Relation Age of Onset  . Adopted: Yes  . Colon cancer Neg Hx     Social History Social History  Substance Use Topics  . Smoking status: Never Smoker   . Smokeless tobacco: Not on file  . Alcohol Use: No    Review of Systems  Constitutional: Negative for fever. Eyes: Negative for visual changes. ENT: Negative for sore throat. Cardiovascular: Negative for chest pain. Respiratory: Negative for shortness of breath. Gastrointestinal: Negative for abdominal pain, vomiting and diarrhea. Genitourinary: Negative for dysuria. Musculoskeletal: Negative for back pain. Skin: Negative for rash. Neurological: Negative for headache. 10 point Review of Systems otherwise negative ____________________________________________   PHYSICAL EXAM:  VITAL SIGNS: ED Triage Vitals  Enc Vitals Group     BP 09/20/15 1028 130/90 mmHg     Pulse Rate 09/20/15 1028 88     Resp 09/20/15 1028 18     Temp 09/20/15 1028 98.3 F (36.8 C)     Temp Source 09/20/15 1028 Oral     SpO2 09/20/15 1024 99 %     Weight 09/20/15 1028 250 lb (113.399 kg)     Height 09/20/15 1028  (1.676 m)     Head Cir --      Peak Flow --      Pain Score 09/20/15 1029 0     Pain Loc --      Pain Edu? --      Excl. in GC? --      Constitutional: Alert and oriented. Well appearing and in no distress. Eyes: Conjunctivae are normal. PERRL. Normal extraocular movements. ENT   Head: Normocephalic and atraumatic.   Nose: No congestion/rhinnorhea.   Mouth/Throat: Mucous membranes are moist.   Neck: No stridor. Cardiovascular/Chest: Normal rate, regular rhythm.  No murmurs, rubs, or  gallops. Respiratory: Normal respiratory effort without tachypnea  nor retractions. Breath sounds are clear and equal bilaterally. No wheezes/rales/rhonchi. Gastrointestinal: Soft. No distention, no guarding, no rebound. Nontender   Genitourinary/rectal:Deferred Musculoskeletal: Nontender with normal range of motion in all extremities. No joint effusions.  No lower extremity tenderness.  No edema. Neurologic:  Normal speech and language. No gross or focal neurologic deficits are appreciated. Skin:  Skin is warm, dry and intact. No rash noted. Psychiatric:depressed mood and flat affect. Reports vague suicidal ideation, but no active suicidal ideation or plan. Does not appear to be interacting with any internal stimuli.  ____________________________________________   EKG I, Governor Rooks, MD, the attending physician have personally viewed and interpreted all ECGs.  No EKG performed ____________________________________________  LABS (pertinent positives/negatives)  Comprehensive metabolic panel without significant abnormality Alcohol negative Salicylate level negative Acetaminophen level negative CBC within normal limits Urine drug screen positive for opiates  ____________________________________________  RADIOLOGY All Xrays were viewed by me. Imaging interpreted by Radiologist.  none __________________________________________  PROCEDURES  Procedure(s) performed: None  Critical Care performed: None  ____________________________________________   ED COURSE / ASSESSMENT AND PLAN  CONSULTATIONS: consult to psychiatry and TTS  Pertinent labs & imaging results that were available during my care of the patient were reviewed by me and considered in my medical decision making (see chart for details).   She is stating worsening depressed mood with vague suicidal ideation and some hearing voices. Because of this I will go ahead and place her on involuntary commitment so she can  see the psychiatrist. I have taken her off of one-on-one here in the emergency department because she is hopeful for help and has no active suicidal ideation here in the emergency department.  Patient okay to be moved to the behavioral holding unit for psychiatric evaluation and possible treatment/disposition.  Patient / Family / Caregiver informed of clinical course, medical decision-making process, and agree with plan.   ___________________________________________   FINAL CLINICAL IMPRESSION(S) / ED DIAGNOSES   Final diagnoses:  Suicidal ideation  Depression       Governor Rooks, MD 09/20/15 1340

## 2015-09-20 NOTE — ED Notes (Signed)
Pt with multiple peircings. Pt lip ring silver colored with ball on one side, other side flat. Pt eyering silver colored with balls at each end. Patient earrings, 3 total, yellow colored. 2 of them have a white colored stone in the center one has a pink colored stone in the center. Earrings placed in sterile container, labelled and placed in bag with patients clothes. Pt lip and eye ing placed in separate sterile container, labelled and stored in patient belonging bag with clothes. Clothes placed in locker assigned to room 23.

## 2015-09-20 NOTE — ED Notes (Signed)
BEHAVIORAL HEALTH ROUNDING Patient sleeping: Yes  Patient alert and oriented: YES Behavior appropriate: Yes  Nutrition and fluids offered: YES Toileting and hygiene offered: NO Sitter present:  No Law enforcement present: No

## 2015-09-20 NOTE — ED Notes (Signed)
IVC/Consult completed/pending disposition 

## 2015-09-20 NOTE — ED Notes (Signed)
BEHAVIORAL HEALTH ROUNDING Patient sleeping: No. Patient alert and oriented: yes Behavior appropriate: Yes.  ; If no, describe:  Nutrition and fluids offered: Yes  Toileting and hygiene offered: Yes  Sitter present: yes Law enforcement present: No 

## 2015-09-20 NOTE — ED Notes (Signed)
Patient resting quietly in bed and watching TV.  Will continue to monitor with security cameras and q.15 minute safety checks.  Patient is complaining of abdominal pain.  Ginger-ale was provided as requested by the patient.

## 2015-09-20 NOTE — Consult Note (Addendum)
[facility] Consult   Reason for Consult:  Depression and suicidal ideation Referring Physician: Dr. Reita Cliche Patient Identification: Tracy Petersen MRN:  [id number] Principal Diagnosis: Major depressive disorder Diagnosis:   Patient Active Problem List   Diagnosis Date Noted  . Nausea [R11.0] 08/11/2012  . Diarrhea [R19.7] 08/11/2012  . Constipation [K59.00] 07/31/2012  . Abdominal pain [R10.9] 06/30/2012  . Alternating constipation and diarrhea [R19.8] 06/30/2012    Total Time spent with patient: 1 hour  Subjective:   Tracy Petersen is a 24 y.o. female patient admitted with per her report past diagnoses of major depressive disorder, bipolar depression, borderline personality disorder and anxiety.   Patient's chief complaint is "just feeling depressed and anxious, thoughts of being dead."  HPI:  Patient states that for the past 3 days she's been having depressed mood, anhedonia, poor appetite, poor concentration, low energy and poor sleep. She relates that her difficulty sleeping is more related to nightmares from some pastor manic events. She clarified her suicidal ideation is not involving any intent or plan to kill herself but rather thoughts that she might be better off dead. When asked about any triggers or stressors she states that she does not get along well with family and that the holidays intensify this issue. She has been residing at a group home for the past 7 years. She indicated that today she called her psychiatrist and he indicated that if she did not feel safe she would need to seek emergency treatment.  Despite the patient states she's been diagnosed bipolar disorder she denied symptoms of mania. She denied ever experiencing symptoms of psychosis. He did indicate that she has cut for emotional regulation and last did this 1 year ago.  Past Psychiatric History: Patient's estimates she's been hospitalized 15 times. She states that the hospitalizations  occurred at [facility], [place], [place] and [facility]. She dates the most recent hospitalization was at [facility] and she clarifies it was overnight in the emergency room as opposed to an admission to the psychiatric unit. She indicates this occurred 6 months ago. However review of the records indicate there was a hospitalization for depression in October 2016.  Patient has been getting outpatient psychiatric treatment from [facility] in [facility] by Dr. wall. He also sees a therapist there [place]. She has been seen at [facility] for 7 years.  Indicates past trials of Seroquel will but states it was too strong and made her sleep. She states that she tried Abilify but it caused suicidal ideation. She states that she had been on [place] salty but route calls there was some side effect with that. She states she had side effects with Wellbutrin.  Risk to Self: Suicidal Ideation: Yes-Currently Present Suicidal Intent: No-Not Currently/Within Last 6 Months Is patient at risk for suicide?: No Suicidal Plan?: No Access to Means: No What has been your use of drugs/alcohol within the last 12 months?: None Reported How many times?: 3 (Choking and overdose) Other Self Harm Risks: History of choking herself  Triggers for Past Attempts: Family contact, Other (Comment), Anniversary Engineer, structural) Intentional Self Injurious Behavior: Cutting (Choking) Comment - Self Injurious Behavior: Haven't cut in over a year, per patient's report Risk to Others: Homicidal Ideation: No Thoughts of Harm to Others: No Current Homicidal Intent: No Current Homicidal Plan: No Access to Homicidal Means: No Identified Victim: None Reported History of harm to others?: No Assessment of Violence: None Noted Violent Behavior Description: None Reported Does patient have  access to weapons?: No Criminal Charges Pending?: No Does patient have a court date: No Prior Inpatient Therapy: Prior Inpatient Therapy: Yes  (Approximately 15x) Prior Therapy Dates: 02/2015 (Most recent, It was a ER Visit) Prior Therapy Facilty/Provider(s): [place], [place], [place] Reason for Treatment: MDD , Bipolar, Anxiety and BPD Prior Outpatient Therapy: Prior Outpatient Therapy: Yes Prior Therapy Dates: Current Prior Therapy Facilty/Provider(s): [facility] Reason for Treatment: MDD , Bipolar, Anxiety and BPD. Does patient have an ACCT team?: No Does patient have Intensive In-House Services?  : No Does patient have [facility] services? : No Does patient have P4CC services?: No  Past Medical History:  Past Medical History  Diagnosis Date  . Borderline personality disorder   . Bipolar 1 disorder ([place])   . Acid reflux   . Hypothyroidism   . Depression with anxiety   . Asthma   . Fracture     fracture to rt. knee     Past Surgical History  Procedure Laterality Date  . Cholecystectomy  06/11/09    chronic cholecystitis  . Surgery as a baby      few months old, ?pyloric stenosis  . Liver biopsy  06/11/09    [place]-No cirrhosis or bridging fibrosis, minimal portal lymphocytic inflammation & mild microvesicular steatosis  . Colonoscopy with propofol  08/30/2012    [facility]:YBWLS internal hemorrhoids/Tortuous redundant sigmoid and transverse colon  . Esophagogastroduodenoscopy (egd) with propofol  08/30/2012    Procedure: ESOPHAGOGASTRODUODENOSCOPY (EGD) WITH PROPOFOL;  Surgeon: Danie Binder, MD;  Location: [facility];  Service: Endoscopy;  Laterality: N/A;  start at 12:11  . Esophageal biopsy  08/30/2012    Procedure: BIOPSY;  Surgeon: Danie Binder, MD;  Location: [facility];  Service: Endoscopy;  Laterality: N/A;  Random colon biopsies; Duodenal biopsies; Gastric Biopsies   Family History:  Family History  Problem Relation Age of Onset  . Adopted: Yes  . Colon cancer Neg Hx    Family Psychiatric  History: Patient indicates that both of her biological parents had mental retardation. Social History:   History  Alcohol Use No     History  Drug Use No    Social History   Social History  . Marital Status: Divorced    Spouse Name: N/A  . Number of Children: N/A  . Years of Education: N/A   Occupational History  . disabled    Social History Main Topics  . Smoking status: Never Smoker   . Smokeless tobacco: Not on file  . Alcohol Use: No  . Drug Use: No  . Sexual Activity: Not on file   Other Topics Concern  . Not on file   Social History Narrative   Lives in [place] at [facility]         Additional Social History:    Pain Medications: See PTA Prescriptions: See PTA Over the Counter: See PTA History of alcohol / drug use?: No history of alcohol / drug abuse Longest period of sobriety (when/how long): No Abuse Reported Negative Consequences of Use:  (No Abuse Reported) Withdrawal Symptoms:  (No Abuse Reported)                     Allergies:   Allergies  Allergen Reactions  . Penicillins     Red rash and itching    Labs:  Results for orders placed or performed during the hospital encounter of 09/20/15 (from the past 48 hour(s))  Comprehensive metabolic panel  Status: Abnormal   Collection Time: 09/20/15 10:22 AM  Result Value Ref Range   Sodium 140 135 - 145 mmol/L   Potassium 3.7 3.5 - 5.1 mmol/L   Chloride 111 101 - 111 mmol/L   CO2 24 22 - 32 mmol/L   Glucose, Bld 89 65 - 99 mg/dL   BUN <5 (L) 6 - 20 mg/dL   Creatinine, Ser 0.85 0.44 - 1.00 mg/dL   Calcium 9.8 8.9 - 10.3 mg/dL   Total Protein 7.8 6.5 - 8.1 g/dL   Albumin 3.9 3.5 - 5.0 g/dL   AST 21 15 - 41 U/L   ALT 24 14 - 54 U/L   Alkaline Phosphatase 75 38 - 126 U/L   Total Bilirubin 0.5 0.3 - 1.2 mg/dL   GFR calc non Af Amer >60 >60 mL/min   GFR calc Af Amer >60 >60 mL/min    Comment: (NOTE) The eGFR has been calculated using the CKD EPI equation. This calculation has not been validated in all clinical situations. eGFR's persistently <60 mL/min signify possible Chronic  Kidney Disease.    Anion gap 5 5 - 15  Ethanol (ETOH)     Status: None   Collection Time: 09/20/15 10:22 AM  Result Value Ref Range   Alcohol, Ethyl (B) <5 <5 mg/dL    Comment:        LOWEST DETECTABLE LIMIT FOR SERUM ALCOHOL IS 5 mg/dL FOR MEDICAL PURPOSES ONLY   Salicylate level     Status: None   Collection Time: 09/20/15 10:22 AM  Result Value Ref Range   Salicylate Lvl <7.2 2.8 - 30.0 mg/dL  Acetaminophen level     Status: Abnormal   Collection Time: 09/20/15 10:22 AM  Result Value Ref Range   Acetaminophen (Tylenol), Serum <10 (L) 10 - 30 ug/mL    Comment:        THERAPEUTIC CONCENTRATIONS VARY SIGNIFICANTLY. A RANGE OF 10-30 ug/mL MAY BE AN EFFECTIVE CONCENTRATION FOR MANY PATIENTS. HOWEVER, SOME ARE BEST TREATED AT CONCENTRATIONS OUTSIDE THIS RANGE. ACETAMINOPHEN CONCENTRATIONS >150 ug/mL AT 4 HOURS AFTER INGESTION AND >50 ug/mL AT 12 HOURS AFTER INGESTION ARE OFTEN ASSOCIATED WITH TOXIC REACTIONS.   CBC     Status: None   Collection Time: 09/20/15 10:22 AM  Result Value Ref Range   WBC 10.2 3.6 - 11.0 K/uL   RBC 4.41 3.80 - 5.20 MIL/uL   Hemoglobin 12.7 12.0 - 16.0 g/dL   HCT 38.9 35.0 - 47.0 %   MCV 88.2 80.0 - 100.0 fL   MCH 28.9 26.0 - 34.0 pg   MCHC 32.7 32.0 - 36.0 g/dL   RDW 14.3 11.5 - 14.5 %   Platelets 284 150 - 440 K/uL  Urine Drug Screen, Qualitative ([facility] only)     Status: Abnormal   Collection Time: 09/20/15 10:22 AM  Result Value Ref Range   Tricyclic, Ur Screen NONE DETECTED NONE DETECTED   Amphetamines, Ur Screen NONE DETECTED NONE DETECTED   MDMA (Ecstasy)Ur Screen NONE DETECTED NONE DETECTED   Cocaine Metabolite,Ur Post NONE DETECTED NONE DETECTED   Opiate, Ur Screen POSITIVE (A) NONE DETECTED   Phencyclidine (PCP) Ur S NONE DETECTED NONE DETECTED   Cannabinoid 50 Ng, Ur [place] NONE DETECTED NONE DETECTED   Barbiturates, Ur Screen NONE DETECTED NONE DETECTED   Benzodiazepine, Ur Scrn NONE DETECTED NONE DETECTED   Methadone Scn, Ur NONE  DETECTED NONE DETECTED    Comment: (NOTE) 536  Tricyclics, urine  Cutoff 1000 ng/mL 200  Amphetamines, urine             Cutoff 1000 ng/mL 300  MDMA (Ecstasy), urine           Cutoff 500 ng/mL 400  Cocaine Metabolite, urine       Cutoff 300 ng/mL 500  Opiate, urine                   Cutoff 300 ng/mL 600  Phencyclidine (PCP), urine      Cutoff 25 ng/mL 700  Cannabinoid, urine              Cutoff 50 ng/mL 800  Barbiturates, urine             Cutoff 200 ng/mL 900  Benzodiazepine, urine           Cutoff 200 ng/mL 1000 Methadone, urine                Cutoff 300 ng/mL 1100 1200 The urine drug screen provides only a preliminary, unconfirmed 1300 analytical test result and should not be used for non-medical 1400 purposes. Clinical consideration and professional judgment should 1500 be applied to any positive drug screen result due to possible 1600 interfering substances. A more specific alternate chemical method 1700 must be used in order to obtain a confirmed analytical result.  1800 Gas chromato graphy / mass spectrometry (GC/MS) is the preferred 1900 confirmatory method.     No current facility-administered medications for this encounter.   Current Outpatient Prescriptions  Medication Sig Dispense Refill  . albuterol (PROVENTIL HFA;VENTOLIN HFA) 108 (90 BASE) MCG/ACT inhaler Inhale 1-2 puffs into the lungs every 6 (six) hours as needed for wheezing. 1 Inhaler 0  . clonazePAM (KLONOPIN) 0.5 MG tablet Take 0.5-1 mg by mouth at bedtime. Takes 0.5 mg (1 tab) in the morning and 2 tabs at bedtime    . cyanocobalamin 100 MCG tablet Take 500 mcg by mouth daily.    Marland Kitchen FLUoxetine (PROZAC) 40 MG capsule Take 40 mg by mouth daily.    Marland Kitchen gabapentin (NEURONTIN) 300 MG capsule Take 300 mg by mouth at bedtime.    Marland Kitchen levothyroxine (SYNTHROID, LEVOTHROID) 25 MCG tablet Take 75 mcg by mouth daily.     Marland Kitchen lithium carbonate 300 MG capsule Take 300-600 mg by mouth 2 (two) times daily. Takes 1  capsule (300 mg) in the morning and takes 2 capsules (625m) at bedtime    . pantoprazole (PROTONIX) 40 MG tablet Take 1 tablet (40 mg total) by mouth daily. 30 tablet 5  . traZODone (DESYREL) 50 MG tablet Take 25 mg by mouth at bedtime.    .Marland Kitchenacidophilus (RISAQUAD) CAPS Take 1 capsule by mouth daily.    . baclofen (LIORESAL) 10 MG tablet Take 1 tablet (10 mg total) by mouth 3 (three) times daily. 30 tablet 0  . HYDROcodone-ibuprofen (VICOPROFEN) 7.5-200 MG per tablet Take 1 tablet by mouth every 8 (eight) hours as needed. pain    . iloperidone (FANAPT) 4 MG TABS Take 2 mg by mouth at bedtime.    . Linaclotide (LINZESS) 290 MCG CAPS Take 290 mcg by mouth daily.    .Lenard ForthTriphasic (TRINESSA, 28, PO) Take 1 tablet by mouth daily.    . promethazine (PHENERGAN) 25 MG tablet Take 25 mg by mouth every 6 (six) hours as needed. nausea      Musculoskeletal: Strength & Muscle Tone: within normal limits Gait & Station: normal Patient leans: N/A  Psychiatric Specialty Exam: Review of Systems  Gastrointestinal: Positive for constipation.  Psychiatric/Behavioral: Positive for depression and suicidal ideas (When this is clarified patient states she has no suicidal intent or plan but rather just thoughts that she be better off dead). Negative for hallucinations, memory loss and substance abuse. The patient is not nervous/anxious and does not have insomnia.   All other systems reviewed and are negative.   Blood pressure 130/90, pulse 88, temperature 98.3 F (36.8 C), temperature source Oral, resp. rate 18, height _0  (1.676 m), weight 113.399 kg (250 lb), SpO2 99 %.Body mass index is 40.37 kg/(m^2).  General Appearance: Fairly Groomed  Engineer, water::  Fair  Speech:  Slow  Volume:  Normal  Mood:  Depressed  Affect:  Congruent  Thought Process:  Linear and Logical  Orientation:  Full (Time, Place, and Person)  Thought Content:  Negative  Suicidal Thoughts:  No she articulates more  thoughts that she might be better off dead but no intent or plan for suicide   Homicidal Thoughts:  No  Memory:  Immediate;   Good Recent;   Good Remote;   Good  Judgement:  Fair  Insight:  Fair  Psychomotor Activity:  Psychomotor Retardation mild   Concentration:  Fair  Recall:  Good  Fund of Knowledge:Good  Language: Good  Akathisia:  Negative  Handed:    AIMS (if indicated):     Assets:  Communication Skills Housing  ADL's:  Intact  Cognition: WNL  Sleep:   poor   Treatment Plan Summary: Daily contact with patient to assess and evaluate symptoms and progress in treatment, Medication management and Plan Plan  Disposition: We will observe patient. Patient is denying suicidal ideation with intent or plan at this time. However she is reporting 3 days of depressive symptoms. We will continue her on her medications however we'll discuss the patient increasing her Prozac dose from 40 mg to 60 mg. It may be that some of her presentation is part of her self-reported borderline personality disorder as opposed to being in a major depressive episode. As such patient might stabilize quickly if this is more a product of coping and personality disorder. Perhaps with additional time and observation this may become more clear. Otherwise we'll continue her on other psychiatric medications Klonopin 0.5 millions in the morning, 1 mg at bedtime, lithium 1200 mg at bedtime, trazodone 25 mg at bedtime and gabapentin 600 mg at bedtime.  We have made contact with the patient's group home and they have not indicate any concerns with the patient returning there. However given that the patient's symptoms were discussed with outpatient psychiatrist and she ended here in the emergency room we will observe as noted above.  1540PM went back to discuss with patient's ways that we might assist her with her complaints. I did propose that we could increase her Prozac from 40 mg to 60 mg. I also discussed that she could  spend time here and see if her mood improved over time. Patient stated she did not want to change her medication but rather just see about having a "break" here.   Faith Rogue 09/20/2015 2:03 PM

## 2015-09-20 NOTE — ED Notes (Signed)
Report taken from Karena RN 

## 2015-09-20 NOTE — ED Notes (Signed)
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
## 2015-09-21 NOTE — ED Notes (Signed)
Pt. Noted in room. No complaints or concerns voiced. No distress or abnormal behavior noted. Will continue to monitor with security cameras. Q 15 minute rounds continue. 

## 2015-09-21 NOTE — Discharge Instructions (Signed)
Please seek medical attention and help for any thoughts about wanting to harm herself, harm others, any concerning change in behavior, severe depression, inappropriate drug use or any other new or concerning symptoms. °Major Depressive Disorder °Major depressive disorder is a mental illness. It also may be called clinical depression or unipolar depression. Major depressive disorder usually causes feelings of sadness, hopelessness, or helplessness. Some people with this disorder do not feel particularly sad but lose interest in doing things they used to enjoy (anhedonia). Major depressive disorder also can cause physical symptoms. It can interfere with work, school, relationships, and other normal everyday activities. The disorder varies in severity but is longer lasting and more serious than the sadness we all feel from time to time in our lives. °Major depressive disorder often is triggered by stressful life events or major life changes. Examples of these triggers include divorce, loss of your job or home, a move, and the death of a family member or close friend. Sometimes this disorder occurs for no obvious reason at all. People who have family members with major depressive disorder or bipolar disorder are at higher risk for developing this disorder, with or without life stressors. Major depressive disorder can occur at any age. It may occur just once in your life (single episode major depressive disorder). It may occur multiple times (recurrent major depressive disorder). °SYMPTOMS °People with major depressive disorder have either anhedonia or depressed mood on nearly a daily basis for at least 2 weeks or longer. Symptoms of depressed mood include: °· Feelings of sadness (blue or down in the dumps) or emptiness. °· Feelings of hopelessness or helplessness. °· Tearfulness or episodes of crying (may be observed by others). °· Irritability (children and adolescents). °In addition to depressed mood or anhedonia or  both, people with this disorder have at least four of the following symptoms: °· Difficulty sleeping or sleeping too much.   °· Significant change (increase or decrease) in appetite or weight.   °· Lack of energy or motivation. °· Feelings of guilt and worthlessness.   °· Difficulty concentrating, remembering, or making decisions. °· Unusually slow movement (psychomotor retardation) or restlessness (as observed by others).   °· Recurrent wishes for death, recurrent thoughts of self-harm (suicide), or a suicide attempt. °People with major depressive disorder commonly have persistent negative thoughts about themselves, other people, and the world. People with severe major depressive disorder may experience distorted beliefs or perceptions about the world (psychotic delusions). They also may see or hear things that are not real (psychotic hallucinations). °DIAGNOSIS °Major depressive disorder is diagnosed through an assessment by your health care provider. Your health care provider will ask about aspects of your daily life, such as mood, sleep, and appetite, to see if you have the diagnostic symptoms of major depressive disorder. Your health care provider may ask about your medical history and use of alcohol or drugs, including prescription medicines. Your health care provider also may do a physical exam and blood work. This is because certain medical conditions and the use of certain substances can cause major depressive disorder-like symptoms (secondary depression). Your health care provider also may refer you to a mental health specialist for further evaluation and treatment. °TREATMENT °It is important to recognize the symptoms of major depressive disorder and seek treatment. The following treatments can be prescribed for this disorder:   °· Medicine. Antidepressant medicines usually are prescribed. Antidepressant medicines are thought to correct chemical imbalances in the brain that are commonly associated with  major depressive disorder. Other types of medicine may be   added if the symptoms do not respond to antidepressant medicines alone or if psychotic delusions or hallucinations occur. °· Talk therapy. Talk therapy can be helpful in treating major depressive disorder by providing support, education, and guidance. Certain types of talk therapy also can help with negative thinking (cognitive behavioral therapy) and with relationship issues that trigger this disorder (interpersonal therapy). °A mental health specialist can help determine which treatment is best for you. Most people with major depressive disorder do well with a combination of medicine and talk therapy. Treatments involving electrical stimulation of the brain can be used in situations with extremely severe symptoms or when medicine and talk therapy do not work over time. These treatments include electroconvulsive therapy, transcranial magnetic stimulation, and vagal nerve stimulation. °  °This information is not intended to replace advice given to you by your health care provider. Make sure you discuss any questions you have with your health care provider. °  °Document Released: 02/06/2013 Document Revised: 11/02/2014 Document Reviewed: 02/06/2013 °Elsevier Interactive Patient Education ©2016 [employer] ° °

## 2015-09-21 NOTE — ED Notes (Signed)
Pt states she is feeling better after her meds

## 2015-09-21 NOTE — ED Notes (Signed)
Group home staff stated they are on their way

## 2015-09-21 NOTE — ED Notes (Signed)
Pt to be dc group home called

## 2015-09-21 NOTE — ED Notes (Signed)
Patient in bed sleeping.  Will continue to monitor with security cameras and q.15 minute safety checks. 

## 2015-09-21 NOTE — Consult Note (Signed)
[facility] Consult   Reason for Consult:  Follow up Referring Physician:  ER Patient Identification: Tracy Petersen MRN:  [id number] Principal Diagnosis: <principal problem not specified> Diagnosis:   Patient Active Problem List   Diagnosis Date Noted  . Depression [F32.9]   . Nausea [R11.0] 08/11/2012  . Diarrhea [R19.7] 08/11/2012  . Constipation [K59.00] 07/31/2012  . Abdominal pain [R10.9] 06/30/2012  . Alternating constipation and diarrhea [R19.8] 06/30/2012    Total Time spent with patient: 45 minutes  Subjective:   Tracy Petersen is a 24 y.o. female patient admitted with a long H/O MI and has been living at [facility] for 7 yrs.Marland Kitchen  HPI:  Pt has constant problems with her parents when she visits them and problems with her 56 yr old brother.  Past Psychiatric History: Living at [facility] for many yrs.  Risk to Self: Suicidal Ideation: Yes-Currently Present Suicidal Intent: No-Not Currently/Within Last 6 Months Is patient at risk for suicide?: No Suicidal Plan?: No Access to Means: No What has been your use of drugs/alcohol within the last 12 months?: None Reported How many times?: 3 (Choking and overdose) Other Self Harm Risks: History of choking herself  Triggers for Past Attempts: Family contact, Other (Comment), [employer], structural) Intentional Self Injurious Behavior: Cutting (Choking) Comment - Self Injurious Behavior: Haven't cut in over a year, per patient's report Risk to Others: Homicidal Ideation: No Thoughts of Harm to Others: No Current Homicidal Intent: No Current Homicidal Plan: No Access to Homicidal Means: No Identified Victim: None Reported History of harm to others?: No Assessment of Violence: None Noted Violent Behavior Description: None Reported Does patient have access to weapons?: No Criminal Charges Pending?: No Does patient have a court date: No Prior Inpatient Therapy: Prior Inpatient Therapy: Yes (Approximately  15x) Prior Therapy Dates: 02/2015 (Most recent, It was a ER Visit) Prior Therapy Facilty/Provider(s): [facility], [facility], [place] Reason for Treatment: MDD , Bipolar, Anxiety and BPD Prior Outpatient Therapy: Prior Outpatient Therapy: Yes Prior Therapy Dates: Current Prior Therapy Facilty/Provider(s): [facility] Reason for Treatment: MDD , Bipolar, Anxiety and BPD. Does patient have an ACCT team?: No Does patient have Intensive In-House Services?  : No Does patient have [facility] services? : No Does patient have P4CC services?: No  Past Medical History:  Past Medical History  Diagnosis Date  . Borderline personality disorder   . Bipolar 1 disorder ([place])   . Acid reflux   . Hypothyroidism   . Depression with anxiety   . Asthma   . Fracture     fracture to rt. knee     Past Surgical History  Procedure Laterality Date  . Cholecystectomy  06/11/09    chronic cholecystitis  . Surgery as a baby      few months old, ?pyloric stenosis  . Liver biopsy  06/11/09    [place]-No cirrhosis or bridging fibrosis, minimal portal lymphocytic inflammation & mild microvesicular steatosis  . Colonoscopy with propofol  08/30/2012    ION:GEXBM internal hemorrhoids/Tortuous redundant sigmoid and transverse colon  . Esophagogastroduodenoscopy (egd) with propofol  08/30/2012    Procedure: ESOPHAGOGASTRODUODENOSCOPY (EGD) WITH PROPOFOL;  Surgeon: Danie Binder, MD;  Location: [facility];  Service: Endoscopy;  Laterality: N/A;  start at 12:11  . Esophageal biopsy  08/30/2012    Procedure: BIOPSY;  Surgeon: Danie Binder, MD;  Location: [facility];  Service: Endoscopy;  Laterality: N/A;  Random colon biopsies; Duodenal biopsies; Gastric Biopsies   Family History:  Family History  Problem Relation Age of Onset  . Adopted: Yes  . Colon cancer Neg Hx    Family Psychiatric  History: none Social History:  History  Alcohol Use No     History  Drug Use No    Social History   Social  History  . Marital Status: Divorced    Spouse Name: N/A  . Number of Children: N/A  . Years of Education: N/A   Occupational History  . disabled    Social History Main Topics  . Smoking status: Never Smoker   . Smokeless tobacco: Not on file  . Alcohol Use: No  . Drug Use: No  . Sexual Activity: Not on file   Other Topics Concern  . Not on file   Social History Narrative   Lives in [place] at [facility]         Additional Social History:    Pain Medications: See PTA Prescriptions: See PTA Over the Counter: See PTA History of alcohol / drug use?: No history of alcohol / drug abuse Longest period of sobriety (when/how long): No Abuse Reported Negative Consequences of Use:  (No Abuse Reported) Withdrawal Symptoms:  (No Abuse Reported)                     Allergies:   Allergies  Allergen Reactions  . Amoxicillin Other (See Comments)    Unknown reaction not indicated on mar.  . Penicillins Itching and Rash    Red rash and itching. Has patient had a PCN reaction causing immediate rash, facial/tongue/throat swelling, SOB or lightheadedness with hypotension: Yes Has patient had a PCN reaction causing severe rash involving mucus membranes or skin necrosis: No Has patient had a PCN reaction that required hospitalization No not sure  Has patient had a PCN reaction occurring within the last 10 years: No not sure If all of the above answers are "NO", then may proceed with Cephalospo    Labs:  Results for orders placed or performed during the hospital encounter of 09/20/15 (from the past 48 hour(s))  Comprehensive metabolic panel     Status: Abnormal   Collection Time: 09/20/15 10:22 AM  Result Value Ref Range   Sodium 140 135 - 145 mmol/L   Potassium 3.7 3.5 - 5.1 mmol/L   Chloride 111 101 - 111 mmol/L   CO2 24 22 - 32 mmol/L   Glucose, Bld 89 65 - 99 mg/dL   BUN <5 (L) 6 - 20 mg/dL   Creatinine, Ser 0.85 0.44 - 1.00 mg/dL   Calcium 9.8 8.9 - 10.3  mg/dL   Total Protein 7.8 6.5 - 8.1 g/dL   Albumin 3.9 3.5 - 5.0 g/dL   AST 21 15 - 41 U/L   ALT 24 14 - 54 U/L   Alkaline Phosphatase 75 38 - 126 U/L   Total Bilirubin 0.5 0.3 - 1.2 mg/dL   GFR calc non Af Amer >60 >60 mL/min   GFR calc Af Amer >60 >60 mL/min    Comment: (NOTE) The eGFR has been calculated using the CKD EPI equation. This calculation has not been validated in all clinical situations. eGFR's persistently <60 mL/min signify possible Chronic Kidney Disease.    Anion gap 5 5 - 15  Ethanol (ETOH)     Status: None   Collection Time: 09/20/15 10:22 AM  Result Value Ref Range   Alcohol, Ethyl (B) <5 <5 mg/dL    Comment:  LOWEST DETECTABLE LIMIT FOR SERUM ALCOHOL IS 5 mg/dL FOR MEDICAL PURPOSES ONLY   Salicylate level     Status: None   Collection Time: 09/20/15 10:22 AM  Result Value Ref Range   Salicylate Lvl <3.0 2.8 - 30.0 mg/dL  Acetaminophen level     Status: Abnormal   Collection Time: 09/20/15 10:22 AM  Result Value Ref Range   Acetaminophen (Tylenol), Serum <10 (L) 10 - 30 ug/mL    Comment:        THERAPEUTIC CONCENTRATIONS VARY SIGNIFICANTLY. A RANGE OF 10-30 ug/mL MAY BE AN EFFECTIVE CONCENTRATION FOR MANY PATIENTS. HOWEVER, SOME ARE BEST TREATED AT CONCENTRATIONS OUTSIDE THIS RANGE. ACETAMINOPHEN CONCENTRATIONS >150 ug/mL AT 4 HOURS AFTER INGESTION AND >50 ug/mL AT 12 HOURS AFTER INGESTION ARE OFTEN ASSOCIATED WITH TOXIC REACTIONS.   CBC     Status: None   Collection Time: 09/20/15 10:22 AM  Result Value Ref Range   WBC 10.2 3.6 - 11.0 K/uL   RBC 4.41 3.80 - 5.20 MIL/uL   Hemoglobin 12.7 12.0 - 16.0 g/dL   HCT 38.9 35.0 - 47.0 %   MCV 88.2 80.0 - 100.0 fL   MCH 28.9 26.0 - 34.0 pg   MCHC 32.7 32.0 - 36.0 g/dL   RDW 14.3 11.5 - 14.5 %   Platelets 284 150 - 440 K/uL  Urine Drug Screen, Qualitative ([place] only)     Status: Abnormal   Collection Time: 09/20/15 10:22 AM  Result Value Ref Range   Tricyclic, Ur Screen NONE DETECTED  NONE DETECTED   Amphetamines, Ur Screen NONE DETECTED NONE DETECTED   MDMA (Ecstasy)Ur Screen NONE DETECTED NONE DETECTED   Cocaine Metabolite,Ur [place] NONE DETECTED NONE DETECTED   Opiate, Ur Screen POSITIVE (A) NONE DETECTED   Phencyclidine (PCP) Ur S NONE DETECTED NONE DETECTED   Cannabinoid 50 Ng, Ur [place] NONE DETECTED NONE DETECTED   Barbiturates, Ur Screen NONE DETECTED NONE DETECTED   Benzodiazepine, Ur Scrn NONE DETECTED NONE DETECTED   Methadone Scn, Ur NONE DETECTED NONE DETECTED    Comment: (NOTE) 076  Tricyclics, urine               Cutoff 1000 ng/mL 200  Amphetamines, urine             Cutoff 1000 ng/mL 300  MDMA (Ecstasy), urine           Cutoff 500 ng/mL 400  Cocaine Metabolite, urine       Cutoff 300 ng/mL 500  Opiate, urine                   Cutoff 300 ng/mL 600  Phencyclidine (PCP), urine      Cutoff 25 ng/mL 700  Cannabinoid, urine              Cutoff 50 ng/mL 800  Barbiturates, urine             Cutoff 200 ng/mL 900  Benzodiazepine, urine           Cutoff 200 ng/mL 1000 Methadone, urine                Cutoff 300 ng/mL 1100 1200 The urine drug screen provides only a preliminary, unconfirmed 1300 analytical test result and should not be used for non-medical 1400 purposes. Clinical consideration and professional judgment should 1500 be applied to any positive drug screen result due to possible 1600 interfering substances. A more specific alternate chemical method 1700 must be used in order to obtain  a confirmed analytical result.  1800 Gas chromato graphy / mass spectrometry (GC/MS) is the preferred 1900 confirmatory method.     Current Facility-Administered Medications  Medication Dose Route Frequency Provider Last Rate Last Dose  . clonazePAM (KLONOPIN) tablet 0.5 mg  0.5 mg Oral BH-q7a Marjie Skiff, MD   0.5 mg at 09/21/15 0951  . clonazePAM (KLONOPIN) tablet 1 mg  1 mg Oral QHS Marjie Skiff, MD   1 mg at 09/20/15 2146  . FLUoxetine (PROZAC) capsule 40 mg   40 mg Oral QHS Marjie Skiff, MD   40 mg at 09/20/15 2147  . gabapentin (NEURONTIN) tablet 600 mg  600 mg Oral QHS Marjie Skiff, MD   600 mg at 09/20/15 2146  . lithium carbonate capsule 1,200 mg  1,200 mg Oral QHS Marjie Skiff, MD   1,200 mg at 09/20/15 2147  . traZODone (DESYREL) tablet 25 mg  25 mg Oral QHS Marjie Skiff, MD   25 mg at 09/20/15 2145   Current Outpatient Prescriptions  Medication Sig Dispense Refill  . albuterol (PROVENTIL HFA;VENTOLIN HFA) 108 (90 BASE) MCG/ACT inhaler Inhale 1-2 puffs into the lungs every 6 (six) hours as needed for wheezing. 1 Inhaler 0  . clonazePAM (KLONOPIN) 0.5 MG tablet Take 0.5-1 mg by mouth at bedtime. Takes 0.5 mg (1 tab) orally in the morning and 2 tablets (63m) orally once a day at bedtime.    . Cyanocobalamin (VITAMIN B-12 PO) Take 1 tablet by mouth daily.    .Marland KitchenFLUoxetine (PROZAC) 40 MG capsule Take 40 mg by mouth daily.    .Marland Kitchengabapentin (NEURONTIN) 300 MG capsule Take 300 mg by mouth at bedtime.    .Marland Kitchenlevothyroxine (SYNTHROID, LEVOTHROID) 75 MCG tablet Take 75 mcg by mouth daily before breakfast.    . lithium carbonate 300 MG capsule Take 900 mg by mouth at bedtime.     . pantoprazole (PROTONIX) 40 MG tablet Take 1 tablet (40 mg total) by mouth daily. 30 tablet 5  . traZODone (DESYREL) 50 MG tablet Take 25 mg by mouth at bedtime.    .Marland Kitchenacidophilus (RISAQUAD) CAPS Take 1 capsule by mouth daily.    . baclofen (LIORESAL) 10 MG tablet Take 1 tablet (10 mg total) by mouth 3 (three) times daily. 30 tablet 0  . HYDROcodone-ibuprofen (VICOPROFEN) 7.5-200 MG per tablet Take 1 tablet by mouth every 8 (eight) hours as needed. pain    . iloperidone (FANAPT) 4 MG TABS Take 2 mg by mouth at bedtime.    . Linaclotide (LINZESS) 290 MCG CAPS Take 290 mcg by mouth daily.    .Lenard ForthTriphasic (TRINESSA, 28, PO) Take 1 tablet by mouth daily.    . promethazine (PHENERGAN) 25 MG tablet Take 25 mg by mouth every 6 (six) hours as needed.  nausea      Musculoskeletal: Strength & Muscle Tone: within normal limits Gait & Station: normal Patient leans: N/A  Psychiatric Specialty Exam: Review of Systems  All other systems reviewed and are negative.   Blood pressure 110/71, pulse 72, temperature 98.2 F (36.8 C), temperature source Oral, resp. rate 14, height 5' 6"  (1.676 m), weight 250 lb (113.399 kg), SpO2 100 %.Body mass index is 40.37 kg/(m^2).  General Appearance: Casual  Eye Contact::  Fair  Speech:  Normal Rate  Volume:  Normal  Mood:  Anxious  Affect:  Appropriate  Thought Process:  Circumstantial  Orientation:  Full (Time, Place, and Person)  Thought Content:  NA  Suicidal Thoughts:  No  Homicidal Thoughts:  No  Memory:  Immediate;   Fair Recent;   Fair Remote;   Fair fair  Judgement:  Fair  Insight:  Fair  Psychomotor Activity:  Normal  Concentration:  Fair  Recall:  [employer] of Knowledge:Fair  Language: Fair  Akathisia:  No  Handed:  Right  AIMS (if indicated):     Assets:  Communication Skills  ADL's:  Intact  Cognition: WNL  Sleep:      Treatment Plan Summary: Plan D/C IVC and Discharge pt back to [facility] as she contracts for safety and wans to get help from Counseling.  Disposition: No evidence of imminent risk to self or others at present.    Camie Patience K 09/21/2015 2:25 PM

## 2015-09-21 NOTE — ED Notes (Signed)
Pt given breakfast tray

## 2015-09-21 NOTE — ED Notes (Signed)
Patient noted in room sleeping.  Will continue to monitor with security cameras and q.15 minute safety checks. 

## 2015-09-21 NOTE — ED Notes (Signed)
Pt returnning to group home

## 2015-09-21 NOTE — ED Provider Notes (Signed)
-----------------------------------------   3:23 PM on 09/21/2015 -----------------------------------------  Patient has been evaluated by psychiatry. They deem she is safe for discharge back to group home. IVC has been rescinded.   Phineas SemenGraydon Yehia Mcbain, MD 09/21/15 1524

## 2015-09-21 NOTE — ED Notes (Signed)
Patient is sleeping.  Will continue to monitor with security cameras and q.15 minute safety checks. 

## 2015-09-21 NOTE — ED Notes (Addendum)
Pt made aware that she is waiting for Dr , she is in no distress and has no c/o

## 2015-09-21 NOTE — ED Notes (Signed)
Patient sleeping.  Will continue to monitor with security cameras and q.15 minute safety checks. 

## 2015-09-21 NOTE — ED Notes (Signed)
Pt ambulates without problem. 

## 2015-09-21 NOTE — ED Notes (Signed)
Pt awake waiting for md no c/o

## 2015-09-21 NOTE — ED Notes (Signed)

## 2015-11-14 ENCOUNTER — Emergency Department: Payer: Medicaid Other

## 2015-11-14 ENCOUNTER — Emergency Department
Admission: EM | Admit: 2015-11-14 | Discharge: 2015-11-15 | Disposition: A | Discharge status: Home / Self Care | Payer: Medicaid Other | Attending: Emergency Medicine | Admitting: Emergency Medicine

## 2015-11-14 DIAGNOSIS — Z3202 Encounter for pregnancy test, result negative: Secondary | ICD-10-CM | POA: Insufficient documentation

## 2015-11-14 DIAGNOSIS — N390 Urinary tract infection, site not specified: Secondary | ICD-10-CM | POA: Diagnosis not present

## 2015-11-14 DIAGNOSIS — Z880 Allergy status to penicillin: Secondary | ICD-10-CM | POA: Diagnosis not present

## 2015-11-14 DIAGNOSIS — Z79899 Other long term (current) drug therapy: Secondary | ICD-10-CM | POA: Diagnosis not present

## 2015-11-14 DIAGNOSIS — R1084 Generalized abdominal pain: Secondary | ICD-10-CM

## 2015-11-14 DIAGNOSIS — N760 Acute vaginitis: Secondary | ICD-10-CM

## 2015-11-14 DIAGNOSIS — E669 Obesity, unspecified: Secondary | ICD-10-CM | POA: Diagnosis not present

## 2015-11-14 DIAGNOSIS — N898 Other specified noninflammatory disorders of vagina: Secondary | ICD-10-CM

## 2015-11-14 LAB — COMPREHENSIVE METABOLIC PANEL
ALBUMIN: 3.6 g/dL (ref 3.5–5.0)
ALT: 18 U/L (ref 14–54)
ANION GAP: 7 (ref 5–15)
AST: 14 U/L — AB (ref 15–41)
Alkaline Phosphatase: 81 U/L (ref 38–126)
BUN: 8 mg/dL (ref 6–20)
CHLORIDE: 110 mmol/L (ref 101–111)
CO2: 22 mmol/L (ref 22–32)
Calcium: 9.1 mg/dL (ref 8.9–10.3)
Creatinine, Ser: 0.8 mg/dL (ref 0.44–1.00)
GFR calc Af Amer: >60 mL/min (ref >60)
GFR calc non Af Amer: >60 mL/min (ref >60)
Glucose, Bld: 109 mg/dL — ABNORMAL HIGH (ref 65–99)
POTASSIUM: 3.5 mmol/L (ref 3.5–5.1)
SODIUM: 139 mmol/L (ref 135–145)
Total Bilirubin: 0.7 mg/dL (ref 0.3–1.2)
Total Protein: 7.8 g/dL (ref 6.5–8.1)

## 2015-11-14 LAB — CBC WITH DIFFERENTIAL/PLATELET
BASOS ABS: 0 10*3/uL (ref 0–0.1)
BASOS PCT: 1 %
EOS ABS: 0.2 10*3/uL (ref 0–0.7)
Eosinophils Relative: 2 %
HCT: 38 % (ref 35.0–47.0)
Hemoglobin: 12.4 g/dL (ref 12.0–16.0)
Lymphocytes Relative: 38 %
Lymphs Abs: 3.1 10*3/uL (ref 1.0–3.6)
MCH: 28.7 pg (ref 26.0–34.0)
MCHC: 32.5 g/dL (ref 32.0–36.0)
MCV: 88.2 fL (ref 80.0–100.0)
MONO ABS: 0.5 10*3/uL (ref 0.2–0.9)
MONOS PCT: 6 %
NEUTROS ABS: 4.4 10*3/uL (ref 1.4–6.5)
NEUTROS PCT: 53 %
Platelets: 279 10*3/uL (ref 150–440)
RBC: 4.31 MIL/uL (ref 3.80–5.20)
RDW: 14.5 % (ref 11.5–14.5)
WBC: 8.2 10*3/uL (ref 3.6–11.0)

## 2015-11-14 LAB — URINALYSIS COMPLETE WITH MICROSCOPIC (ARMC ONLY)
Bilirubin Urine: NEGATIVE
GLUCOSE, UA: NEGATIVE mg/dL
Ketones, ur: NEGATIVE mg/dL
NITRITE: NEGATIVE
PH: 7 (ref 5.0–8.0)
PROTEIN: NEGATIVE mg/dL
SPECIFIC GRAVITY, URINE: 1.01 (ref 1.005–1.030)

## 2015-11-14 LAB — LIPASE, BLOOD: Lipase: 26 U/L (ref 11–51)

## 2015-11-14 LAB — POCT PREGNANCY, URINE: Preg Test, Ur: NEGATIVE

## 2015-11-14 LAB — PREGNANCY, URINE: PREG TEST UR: NEGATIVE

## 2015-11-14 NOTE — ED Notes (Signed)
Pt to CT at this time.

## 2015-11-14 NOTE — ED Notes (Signed)
Pt in with co generalized abd pain for a week and chest pain, hx of reflux.  Pt also having vaginal bleeding and states normally does not have periods due to implanon.

## 2015-11-14 NOTE — ED Provider Notes (Signed)
[facility] Emergency Department Provider Note  ____________________________________________  Time seen: Approximately 11:36 PM  I have reviewed the triage vital signs and the nursing notes.   HISTORY  Chief Complaint Abdominal Pain    HPI Tracy Petersen is a 25 y.o. female with a past medical history that includes bipolar disorder, personality disorder, chronic abdominal pain, acid reflux, major depressive disorder, and prior visits for suicidal ideation presents with abdominal pain as well as about a week of vaginal bleeding and itching.  She states that because she has a contraceptive implant she is not used to having a period so she is concerned about why she may be having vaginal bleeding at this time.  This combined with what she describes as generalized abdominal pain which is at times sharp and stabbing, mild to moderate, and accompanied with nausea is what brings her to the emergency department tonight.  A review of her past medical history does indicate that she has had similar issues in the past with her abdominal pain although she does not seem to have had the complaints of the vaginal symptoms before.  She also describes bilateral flank pain as well as blood in her urine.  She states that she changes her pad several times a day but that the blood is most noticeable when she urinates.  She describes the blood as brown and foul smelling.  She also has "itching on the outside" and a history of yeast infection.  Nothing is making her symptoms better or worse.    Past Medical History  Diagnosis Date  . Borderline personality disorder   . Bipolar 1 disorder (HCC)   . Acid reflux   . Hypothyroidism   . Depression with anxiety   . Asthma   . Fracture     fracture to rt. knee     Patient Active Problem List   Diagnosis Date Noted  . Depression   . Nausea 08/11/2012  . Diarrhea 08/11/2012  . Constipation 07/31/2012  . Abdominal pain 06/30/2012  .  Alternating constipation and diarrhea 06/30/2012    Past Surgical History  Procedure Laterality Date  . Cholecystectomy  06/11/09    chronic cholecystitis  . Surgery as a baby      few months old, ?pyloric stenosis  . Liver biopsy  06/11/09    [place]-No cirrhosis or bridging fibrosis, minimal portal lymphocytic inflammation & mild microvesicular steatosis  . Colonoscopy with propofol  08/30/2012    [id number] internal hemorrhoids/Tortuous redundant sigmoid and transverse colon  . Esophagogastroduodenoscopy (egd) with propofol  08/30/2012    Procedure: ESOPHAGOGASTRODUODENOSCOPY (EGD) WITH PROPOFOL;  Surgeon: West Bali, MD;  Location: [facility];  Service: Endoscopy;  Laterality: N/A;  start at 12:11  . Esophageal biopsy  08/30/2012    Procedure: BIOPSY;  Surgeon: West Bali, MD;  Location: [facility];  Service: Endoscopy;  Laterality: N/A;  Random colon biopsies; Duodenal biopsies; Gastric Biopsies    Current Outpatient Rx  Name  Route  Sig  Dispense  Refill  . acidophilus (RISAQUAD) CAPS   Oral   Take 1 capsule by mouth daily.         Marland Kitchen albuterol (PROVENTIL HFA;VENTOLIN HFA) 108 (90 BASE) MCG/ACT inhaler   Inhalation   Inhale 1-2 puffs into the lungs every 6 (six) hours as needed for wheezing.   1 Inhaler   0   . baclofen (LIORESAL) 10 MG tablet   Oral   Take 1 tablet (10 mg total) by mouth  3 (three) times daily.   30 tablet   0   . cephALEXin (KEFLEX) 500 MG capsule   Oral   Take 1 capsule (500 mg total) by mouth 2 (two) times daily.   14 capsule   0   . clonazePAM (KLONOPIN) 0.5 MG tablet   Oral   Take 0.5-1 mg by mouth at bedtime. Takes 0.5 mg (1 tab) orally in the morning and 2 tablets (1mg ) orally once a day at bedtime.         . Cyanocobalamin (VITAMIN B-12 PO)   Oral   Take 1 tablet by mouth daily.         . fluconazole (DIFLUCAN) 150 MG tablet   Oral   Take 1 tablet (150 mg total) by mouth once.   1 tablet   0   . FLUoxetine (PROZAC) 40 MG capsule    Oral   Take 40 mg by mouth daily.         Marland Kitchen gabapentin (NEURONTIN) 300 MG capsule   Oral   Take 300 mg by mouth at bedtime.         Marland Kitchen HYDROcodone-ibuprofen (VICOPROFEN) 7.5-200 MG per tablet   Oral   Take 1 tablet by mouth every 8 (eight) hours as needed. pain         . iloperidone (FANAPT) 4 MG TABS   Oral   Take 2 mg by mouth at bedtime.         Marland Kitchen levothyroxine (SYNTHROID, LEVOTHROID) 75 MCG tablet   Oral   Take 75 mcg by mouth daily before breakfast.         . Linaclotide (LINZESS) 290 MCG CAPS   Oral   Take 290 mcg by mouth daily.         Marland Kitchen lithium carbonate 300 MG capsule   Oral   Take 900 mg by mouth at bedtime.          Lorita Officer Triphasic (TRINESSA, 28, PO)   Oral   Take 1 tablet by mouth daily.         . pantoprazole (PROTONIX) 40 MG tablet   Oral   Take 1 tablet (40 mg total) by mouth daily.   30 tablet   5   . promethazine (PHENERGAN) 25 MG tablet   Oral   Take 25 mg by mouth every 6 (six) hours as needed. nausea         . traZODone (DESYREL) 50 MG tablet   Oral   Take 25 mg by mouth at bedtime.           Allergies Amoxicillin and Penicillins  Family History  Problem Relation Age of Onset  . Adopted: Yes  . Colon cancer Neg Hx     Social History Social History  Substance Use Topics  . Smoking status: Never Smoker   . Smokeless tobacco: Not on file  . Alcohol Use: No    Review of Systems Constitutional: No fever/chills Eyes: No visual changes. ENT: No sore throat. Cardiovascular: Denies chest pain. Respiratory: Denies shortness of breath. Gastrointestinal: Generalized abdominal pain with nausea but no vomiting and no diarrhea.. Genitourinary: Vaginal burning and irritation as well as brown bloody discharge for about a week Musculoskeletal: Intermittent bilateral flank pain Skin: Negative for rash. Neurological: Negative for headaches, focal weakness or numbness.  10-point ROS otherwise  negative.  ____________________________________________   PHYSICAL EXAM:  VITAL SIGNS: ED Triage Vitals  Enc Vitals Group     BP 11/14/15 2129  130/79 mmHg     Pulse Rate 11/14/15 2129 93     Resp 11/14/15 2129 18     Temp 11/14/15 2129 98.3 F (36.8 C)     Temp Source 11/14/15 2129 Oral     SpO2 11/14/15 2129 99 %     Weight 11/14/15 2129 250 lb (113.399 kg)     Height 11/14/15 2129  (1.676 m)     Head Cir --      Peak Flow --      Pain Score 11/14/15 2130 7     Pain Loc --      Pain Edu? --      Excl. in GC? --     Constitutional: Alert and oriented. Well appearing and in no acute distress. Eyes: Conjunctivae are normal. PERRL. EOMI. Head: Atraumatic. Nose: No congestion/rhinnorhea. Mouth/Throat: Mucous membranes are moist.  Oropharynx non-erythematous. Neck: No stridor.   Cardiovascular: Normal rate, regular rhythm. Grossly normal heart sounds.  Good peripheral circulation. Respiratory: Normal respiratory effort.  No retractions. Lungs CTAB. Gastrointestinal: Obese.  Soft with generalized abdominal pain throughout.  No CVA tenderness. Genitourinary: Redness and irritation of the labia consistent with vaginitis, probable candida.  Thick brownish discharge in vaginal vault.  Entire exam caused a great deal of tenderness, difficult to assess, could not completely visualize cervix due to inability of patient to cooperate with exam.  Took swabs for wet prep and GC/chlamydia.  Patient could not tolerate bimanual - at slight external touch, patient crawled up the bed and started yelling "no no no!".  Chaperone (ED tech) assisted throughout procedure. Musculoskeletal: No lower extremity tenderness nor edema.  No joint effusions. Neurologic:  Normal speech and language. No gross focal neurologic deficits are appreciated.  Skin:  Skin is warm, dry and intact. No rash noted. Psychiatric: Mood and affect are flat.   ____________________________________________   LABS (all labs  ordered are listed, but only abnormal results are displayed)  Labs Reviewed  WET PREP, GENITAL - Abnormal; Notable for the following:    WBC, Wet Prep HPF POC FEW (*)    All other components within normal limits  COMPREHENSIVE METABOLIC PANEL - Abnormal; Notable for the following:    Glucose, Bld 109 (*)    AST 14 (*)    All other components within normal limits  URINALYSIS COMPLETEWITH MICROSCOPIC ([facility] ONLY) - Abnormal; Notable for the following:    Color, Urine YELLOW (*)    APPearance HAZY (*)    Hgb urine dipstick 3+ (*)    Leukocytes, UA 1+ (*)    Bacteria, UA RARE (*)    Squamous Epithelial / LPF 0-5 (*)    All other components within normal limits  CHLAMYDIA/NGC RT PCR ([facility] ONLY)  URINE CULTURE  CBC WITH DIFFERENTIAL/PLATELET  LIPASE, BLOOD  PREGNANCY, URINE  POCT PREGNANCY, URINE   ____________________________________________  EKG  ED ECG REPORT I, Bastien Strawser, the attending physician, personally viewed and interpreted this ECG.  Date: 11/14/2015 EKG Time: 21:32 Rate: 89 Rhythm: normal sinus rhythm QRS Axis: normal Intervals: normal ST/T Wave abnormalities: normal Conduction Disutrbances: none Narrative Interpretation: unremarkable  ____________________________________________  RADIOLOGY   Ct Renal Stone Study  11/15/2015  CLINICAL DATA:  Acute onset of bilateral flank pain and vaginal itching. Dark brown discharge. Intermittent nausea and loss of appetite. Initial encounter. EXAM: CT ABDOMEN AND PELVIS WITHOUT CONTRAST TECHNIQUE: Multidetector CT imaging of the abdomen and pelvis was performed following the standard protocol without IV contrast. COMPARISON:  CT of  the abdomen and pelvis from 06/21/2012 FINDINGS: The visualized lung bases are clear. The liver and spleen are unremarkable in appearance. The patient is status post cholecystectomy, with clips noted at the gallbladder fossa. The pancreas and adrenal glands are unremarkable. The  kidneys are unremarkable in appearance. There is no evidence of hydronephrosis. No renal or ureteral stones are seen. No perinephric stranding is appreciated. No free fluid is identified. The small bowel is unremarkable in appearance. The stomach is within normal limits. No acute vascular abnormalities are seen. A circumaortic left renal vein is noted. The appendix is normal in caliber, without evidence of appendicitis. The colon is unremarkable in appearance. The bladder is mildly distended and grossly unremarkable. The uterus is unremarkable in appearance. The ovaries are relatively symmetric. No suspicious adnexal masses are seen. No inguinal lymphadenopathy is seen. No acute osseous abnormalities are identified. IMPRESSION: Unremarkable noncontrast CT of the abdomen and pelvis. Electronically Signed   By: Roanna Raider M.D.   On: 11/15/2015 00:12    ____________________________________________   PROCEDURES  Procedure(s) performed: None  Critical Care performed: No ____________________________________________   INITIAL IMPRESSION / ASSESSMENT AND PLAN / ED COURSE  Pertinent labs & imaging results that were available during my care of the patient were reviewed by me and considered in my medical decision making (see chart for details).  The patient is in no acute distress with normal vital signs.  The symptoms she is describing did not match a single clear diagnosis and her abdominal exam is nonfocal.  Given the hematuria and the generalized abdominal pain as well as reports of flank pain (although she has no CVA tenderness), I will evaluate with a noncontrast renal study CT of her abdomen and pelvis.  I will proceed with a pelvic exam as well.  I anticipate no abnormal findings or emergent medical conditions and that she will be appropriate for outpatient follow-up.  ----------------------------------------- 2:53 AM on 11/15/2015 -----------------------------------------  Unremarkable  workup except for nonspecific vaginitis.  Patient does not want a cream or ointment.  Will try Diflucan which she has taken before.  Also treating for possible mild UTI.    I gave my usual and customary return precautions.   Patient knows to follow up as outpatient.  ____________________________________________  FINAL CLINICAL IMPRESSION(S) / ED DIAGNOSES  Final diagnoses:  Generalized abdominal pain  Vaginal discharge  Vaginitis  UTI (lower urinary tract infection)      NEW MEDICATIONS STARTED DURING THIS VISIT:  New Prescriptions   CEPHALEXIN (KEFLEX) 500 MG CAPSULE    Take 1 capsule (500 mg total) by mouth 2 (two) times daily.   FLUCONAZOLE (DIFLUCAN) 150 MG TABLET    Take 1 tablet (150 mg total) by mouth once.    Loleta Rose, MD 11/15/15 [phone]

## 2015-11-14 NOTE — ED Notes (Signed)
Pt states she is having abdominal pain and itching in vaginal area with dark brown discharge in the area.  States that she has intermittent nausea and that she hasn't had much of an appetite.  Pt states she does not normally have periods due to birth control.

## 2015-11-15 LAB — WET PREP, GENITAL
CLUE CELLS WET PREP: NONE SEEN
Sperm: NONE SEEN
TRICH WET PREP: NONE SEEN
YEAST WET PREP: NONE SEEN

## 2015-11-15 LAB — CHLAMYDIA/NGC RT PCR (ARMC ONLY)
Chlamydia Tr: NOT DETECTED
N GONORRHOEAE: NOT DETECTED

## 2015-11-15 MED ORDER — CEPHALEXIN 500 MG PO CAPS: 500.0000 mg | ORAL_CAPSULE | Freq: Two times a day (BID) | ORAL | Status: DC

## 2015-11-15 MED ORDER — FLUCONAZOLE 150 MG PO TABS: 150.0000 mg | ORAL_TABLET | Freq: Once | ORAL | Status: AC

## 2015-11-15 NOTE — ED Notes (Signed)
Pt discharged to home.  Family member driving.  Discharge instructions reviewed.  Verbalized understanding.  No questions or concerns at this time.  Teach back verified.  Pt in NAD.  No items left in ED.   

## 2015-11-15 NOTE — Discharge Instructions (Signed)
You have been seen in the Emergency Department (ED) for abdominal pain and vaginal bleeding/discharge.  Your evaluation did not identify a clear cause of your symptoms but was generally reassuring.  We provided a prescription for a one-time dose medication which may help with your vaginal rash.  Additionally we provided a prescription for an antibiotic which may help with your mild urinary tract infection (UTI).  Please follow up as instructed above regarding todays emergent visit and the symptoms that are bothering you.  Return to the ED if your abdominal pain worsens or fails to improve, you develop bloody vomiting, bloody diarrhea, you are unable to tolerate fluids due to vomiting, fever greater than 101, or other symptoms that concern you.   Vaginitis Vaginitis is an inflammation of the vagina. It is most often caused by a change in the normal balance of the bacteria and yeast that live in the vagina. This change in balance causes an overgrowth of certain bacteria or yeast, which causes the inflammation. There are different types of vaginitis, but the most common types are:  Bacterial vaginosis.  Yeast infection (candidiasis).  Trichomoniasis vaginitis. This is a sexually transmitted infection (STI).  Viral vaginitis.  Atrophic vaginitis.  Allergic vaginitis. CAUSES  The cause depends on the type of vaginitis. Vaginitis can be caused by:  Bacteria (bacterial vaginosis).  Yeast (yeast infection).  A parasite (trichomoniasis vaginitis)  A virus (viral vaginitis).  Low hormone levels (atrophic vaginitis). Low hormone levels can occur during pregnancy, breastfeeding, or after menopause.  Irritants, such as bubble baths, scented tampons, and feminine sprays (allergic vaginitis). Other factors can change the normal balance of the yeast and bacteria that live in the vagina. These include:  Antibiotic medicines.  Poor hygiene.  Diaphragms, vaginal sponges, spermicides, birth  control pills, and intrauterine devices (IUD).  Sexual intercourse.  Infection.  Uncontrolled diabetes.  A weakened immune system. SYMPTOMS  Symptoms can vary depending on the cause of the vaginitis. Common symptoms include:  Abnormal vaginal discharge.  The discharge is white, gray, or yellow with bacterial vaginosis.  The discharge is thick, white, and cheesy with a yeast infection.  The discharge is frothy and yellow or greenish with trichomoniasis.  A bad vaginal odor.  The odor is fishy with bacterial vaginosis.  Vaginal itching, pain, or swelling.  Painful intercourse.  Pain or burning when urinating. Sometimes, there are no symptoms. TREATMENT  Treatment will vary depending on the type of infection.   Bacterial vaginosis and trichomoniasis are often treated with antibiotic creams or pills.  Yeast infections are often treated with antifungal medicines, such as vaginal creams or suppositories.  Viral vaginitis has no cure, but symptoms can be treated with medicines that relieve discomfort. Your sexual partner should be treated as well.  Atrophic vaginitis may be treated with an estrogen cream, pill, suppository, or vaginal ring. If vaginal dryness occurs, lubricants and moisturizing creams may help. You may be told to avoid scented soaps, sprays, or douches.  Allergic vaginitis treatment involves quitting the use of the product that is causing the problem. Vaginal creams can be used to treat the symptoms. HOME CARE INSTRUCTIONS   Take all medicines as directed by your caregiver.  Keep your genital area clean and dry. Avoid soap and only rinse the area with water.  Avoid douching. It can remove the healthy bacteria in the vagina.  Do not use tampons or have sexual intercourse until your vaginitis has been treated. Use sanitary pads while you have vaginitis.  Wipe from front to back. This avoids the spread of bacteria from the rectum to the vagina.  Let air  reach your genital area.  Wear cotton underwear to decrease moisture buildup.  Avoid wearing underwear while you sleep until your vaginitis is gone.  Avoid tight pants and underwear or nylons without a cotton panel.  Take off wet clothing (especially bathing suits) as soon as possible.  Use mild, non-scented products. Avoid using irritants, such as:  Scented feminine sprays.  Fabric softeners.  Scented detergents.  Scented tampons.  Scented soaps or bubble baths.  Practice safe sex and use condoms. Condoms may prevent the spread of trichomoniasis and viral vaginitis. SEEK MEDICAL CARE IF:   You have abdominal pain.  You have a fever or persistent symptoms for more than 2-3 days.  You have a fever and your symptoms suddenly get worse.   This information is not intended to replace advice given to you by your health care provider. Make sure you discuss any questions you have with your health care provider.   Document Released: 08/09/2007 Document Revised: 02/26/2015 Document Reviewed: 03/24/2012 Elsevier Interactive Patient Education 2016 [employer]  Abdominal Pain, Adult Many things can cause abdominal pain. Usually, abdominal pain is not caused by a disease and will improve without treatment. It can often be observed and treated at home. Your health care provider will do a physical exam and possibly order blood tests and X-rays to help determine the seriousness of your pain. However, in many cases, more time must pass before a clear cause of the pain can be found. Before that point, your health care provider may not know if you need more testing or further treatment. HOME CARE INSTRUCTIONS Monitor your abdominal pain for any changes. The following actions may help to alleviate any discomfort you are experiencing:  Only take over-the-counter or prescription medicines as directed by your health care provider.  Do not take laxatives unless directed to do so by your health  care provider.  Try a clear liquid diet (broth, tea, or water) as directed by your health care provider. Slowly move to a bland diet as tolerated. SEEK MEDICAL CARE IF:  You have unexplained abdominal pain.  You have abdominal pain associated with nausea or diarrhea.  You have pain when you urinate or have a bowel movement.  You experience abdominal pain that wakes you in the night.  You have abdominal pain that is worsened or improved by eating food.  You have abdominal pain that is worsened with eating fatty foods.  You have a fever. SEEK IMMEDIATE MEDICAL CARE IF:  Your pain does not go away within 2 hours.  You keep throwing up (vomiting).  Your pain is felt only in portions of the abdomen, such as the right side or the left lower portion of the abdomen.  You pass bloody or black tarry stools. MAKE SURE YOU:  Understand these instructions.  Will watch your condition.  Will get help right away if you are not doing well or get worse.   This information is not intended to replace advice given to you by your health care provider. Make sure you discuss any questions you have with your health care provider.   Document Released: 07/22/2005 Document Revised: 07/03/2015 Document Reviewed: 06/21/2013 Elsevier Interactive Patient Education 2016 [employer] Inc[employer]   Urinary Tract Infection Urinary tract infections (UTIs) can develop anywhere along your urinary tract. Your urinary tract is your body's drainage system for removing wastes and extra water.  Your urinary tract includes two kidneys, two ureters, a bladder, and a urethra. Your kidneys are a pair of bean-shaped organs. Each kidney is about the size of your fist. They are located below your ribs, one on each side of your spine. CAUSES Infections are caused by microbes, which are microscopic organisms, including fungi, viruses, and bacteria. These organisms are so small that they can only be seen through a microscope. Bacteria  are the microbes that most commonly cause UTIs. SYMPTOMS  Symptoms of UTIs may vary by age and gender of the patient and by the location of the infection. Symptoms in young women typically include a frequent and intense urge to urinate and a painful, burning feeling in the bladder or urethra during urination. Older women and men are more likely to be tired, shaky, and weak and have muscle aches and abdominal pain. A fever may mean the infection is in your kidneys. Other symptoms of a kidney infection include pain in your back or sides below the ribs, nausea, and vomiting. DIAGNOSIS To diagnose a UTI, your caregiver will ask you about your symptoms. Your caregiver will also ask you to provide a urine sample. The urine sample will be tested for bacteria and white blood cells. White blood cells are made by your body to help fight infection. TREATMENT  Typically, UTIs can be treated with medication. Because most UTIs are caused by a bacterial infection, they usually can be treated with the use of antibiotics. The choice of antibiotic and length of treatment depend on your symptoms and the type of bacteria causing your infection. HOME CARE INSTRUCTIONS  If you were prescribed antibiotics, take them exactly as your caregiver instructs you. Finish the medication even if you feel better after you have only taken some of the medication.  Drink enough water and fluids to keep your urine clear or pale yellow.  Avoid caffeine, tea, and carbonated beverages. They tend to irritate your bladder.  Empty your bladder often. Avoid holding urine for long periods of time.  Empty your bladder before and after sexual intercourse.  After a bowel movement, women should cleanse from front to back. Use each tissue only once. SEEK MEDICAL CARE IF:   You have back pain.  You develop a fever.  Your symptoms do not begin to resolve within 3 days. SEEK IMMEDIATE MEDICAL CARE IF:   You have severe back pain or lower  abdominal pain.  You develop chills.  You have nausea or vomiting.  You have continued burning or discomfort with urination. MAKE SURE YOU:   Understand these instructions.  Will watch your condition.  Will get help right away if you are not doing well or get worse.   This information is not intended to replace advice given to you by your health care provider. Make sure you discuss any questions you have with your health care provider.   Document Released: 07/22/2005 Document Revised: 07/03/2015 Document Reviewed: 11/20/2011 Elsevier Interactive Patient Education [employer]

## 2015-11-15 NOTE — ED Notes (Signed)
Pt returned from CT °

## 2015-11-17 LAB — URINE CULTURE: Special Requests: NORMAL

## 2016-01-02 ENCOUNTER — Encounter: Payer: Self-pay | Admitting: Emergency Medicine

## 2016-01-02 ENCOUNTER — Emergency Department: Payer: Medicaid Other

## 2016-01-02 DIAGNOSIS — J45909 Unspecified asthma, uncomplicated: Secondary | ICD-10-CM | POA: Diagnosis not present

## 2016-01-02 DIAGNOSIS — Z792 Long term (current) use of antibiotics: Secondary | ICD-10-CM | POA: Diagnosis not present

## 2016-01-02 DIAGNOSIS — R079 Chest pain, unspecified: Secondary | ICD-10-CM | POA: Diagnosis not present

## 2016-01-02 DIAGNOSIS — Z79899 Other long term (current) drug therapy: Secondary | ICD-10-CM | POA: Diagnosis not present

## 2016-01-02 DIAGNOSIS — Z793 Long term (current) use of hormonal contraceptives: Secondary | ICD-10-CM | POA: Insufficient documentation

## 2016-01-02 DIAGNOSIS — Z880 Allergy status to penicillin: Secondary | ICD-10-CM | POA: Diagnosis not present

## 2016-01-02 DIAGNOSIS — N390 Urinary tract infection, site not specified: Secondary | ICD-10-CM | POA: Insufficient documentation

## 2016-01-02 DIAGNOSIS — R110 Nausea: Secondary | ICD-10-CM | POA: Diagnosis not present

## 2016-01-02 DIAGNOSIS — R109 Unspecified abdominal pain: Secondary | ICD-10-CM | POA: Diagnosis present

## 2016-01-02 LAB — BASIC METABOLIC PANEL
ANION GAP: 4 — AB (ref 5–15)
BUN: 7 mg/dL (ref 6–20)
CALCIUM: 9.1 mg/dL (ref 8.9–10.3)
CO2: 28 mmol/L (ref 22–32)
CREATININE: 0.88 mg/dL (ref 0.44–1.00)
Chloride: 107 mmol/L (ref 101–111)
GFR calc Af Amer: >60 mL/min (ref >60)
GLUCOSE: 106 mg/dL — AB (ref 65–99)
Potassium: 4 mmol/L (ref 3.5–5.1)
Sodium: 139 mmol/L (ref 135–145)

## 2016-01-02 LAB — CBC
HCT: 38.4 % (ref 35.0–47.0)
HEMOGLOBIN: 12.5 g/dL (ref 12.0–16.0)
MCH: 28.1 pg (ref 26.0–34.0)
MCHC: 32.5 g/dL (ref 32.0–36.0)
MCV: 86.6 fL (ref 80.0–100.0)
PLATELETS: 309 10*3/uL (ref 150–440)
RBC: 4.44 MIL/uL (ref 3.80–5.20)
RDW: 16 % — AB (ref 11.5–14.5)
WBC: 12.1 10*3/uL — ABNORMAL HIGH (ref 3.6–11.0)

## 2016-01-02 LAB — TROPONIN I

## 2016-01-02 NOTE — ED Notes (Signed)
Pt presents to ED with dry cough and chest aching for the past week. pain has been "constant" per pt and worsens when moving around. Pt also c/o generalized sharp abd pain that increases with eating. Denies vomiting or diarrhea. Ongoing for the past 5 days. Pt states her symptoms have seemed to get gradually worse and she was becoming concerned. Pt currently has no increased work of breathing or acute distress noted. Skin warm and dry.

## 2016-01-03 ENCOUNTER — Emergency Department
Admission: EM | Admit: 2016-01-03 | Discharge: 2016-01-03 | Disposition: A | Discharge status: Home / Self Care | Payer: Medicaid Other | Attending: Emergency Medicine | Admitting: Emergency Medicine

## 2016-01-03 DIAGNOSIS — N390 Urinary tract infection, site not specified: Secondary | ICD-10-CM

## 2016-01-03 LAB — URINALYSIS COMPLETE WITH MICROSCOPIC (ARMC ONLY)
BILIRUBIN URINE: NEGATIVE
Glucose, UA: NEGATIVE mg/dL
KETONES UR: NEGATIVE mg/dL
Nitrite: NEGATIVE
PH: 7 (ref 5.0–8.0)
Protein, ur: NEGATIVE mg/dL
SPECIFIC GRAVITY, URINE: 1.01 (ref 1.005–1.030)

## 2016-01-03 MED ORDER — ONDANSETRON HCL 4 MG/2ML IJ SOLN
4.0000 mg | Freq: Once | INTRAMUSCULAR | Status: AC
  Administered 2016-01-03: 4 mg via INTRAVENOUS
  Filled 2016-01-03: qty 2

## 2016-01-03 MED ORDER — MORPHINE SULFATE (PF) 4 MG/ML IV SOLN
4.0000 mg | Freq: Once | INTRAVENOUS | Status: DC
  Filled 2016-01-03: qty 1

## 2016-01-03 MED ORDER — NITROFURANTOIN MONOHYD MACRO 100 MG PO CAPS
100.0000 mg | ORAL_CAPSULE | Freq: Once | ORAL | Status: AC
  Administered 2016-01-03: 100 mg via ORAL
  Filled 2016-01-03: qty 1

## 2016-01-03 MED ORDER — NITROFURANTOIN MONOHYD MACRO 100 MG PO CAPS: 100.0000 mg | ORAL_CAPSULE | Freq: Two times a day (BID) | ORAL | Status: AC

## 2016-01-03 MED ORDER — ONDANSETRON 4 MG PO TBDP: 4.0000 mg | ORAL_TABLET | Freq: Three times a day (TID) | ORAL | Status: AC | PRN

## 2016-01-03 MED ORDER — TRAMADOL HCL 50 MG PO TABS: 50.0000 mg | ORAL_TABLET | Freq: Four times a day (QID) | ORAL | Status: DC | PRN

## 2016-01-03 MED ORDER — KETOROLAC TROMETHAMINE 30 MG/ML IJ SOLN
30.0000 mg | Freq: Once | INTRAMUSCULAR | Status: AC
  Administered 2016-01-03: 30 mg via INTRAVENOUS
  Filled 2016-01-03: qty 1

## 2016-01-03 MED ORDER — SODIUM CHLORIDE 0.9 % IV BOLUS (SEPSIS)
1000.0000 mL | Freq: Once | INTRAVENOUS | Status: AC
  Administered 2016-01-03: 1000 mL via INTRAVENOUS

## 2016-01-03 NOTE — Discharge Instructions (Signed)

## 2016-01-03 NOTE — ED Provider Notes (Signed)
[facility] Provider Note  Time seen: 1:48 AM  I have reviewed the triage vital signs and the nursing notes.   HISTORY  Chief Complaint Cough; Chest Pain; Headache; and Abdominal Pain    HPI [name] is a 25 y.o. female with a past medical history of borderline personality disorder, bipolar, hypothyroidism, asthma who presents the emergency department with nausea, chest pain, abdominal pain, cough over the past 5 days. According to the patient for the past 5 days she has been having abdominal pain mostly in the lower abdomen although states it is in the upper abdomen when she eats. Had a cholecystectomy performed many years ago. Also states chest pain which has been coming and going for the past 5 days. Patient states her symptoms have occurred with her menstrual cycle which is ongoing currently. However she thought that the symptoms wouldn't improve, they have not so she came to the emergency department. Denies any acute worsening of the pain. Denies any dysuria. States nausea but denies any vomiting or diarrhea. Patient does state cough with mild congestion. Denies fever.     Past Medical History  Diagnosis Date  . Borderline personality disorder   . Bipolar 1 disorder (HCC)   . Acid reflux   . Hypothyroidism   . Depression with anxiety   . Asthma   . Fracture     fracture to rt. knee     Patient Active Problem List   Diagnosis Date Noted  . Depression   . Nausea 08/11/2012  . Diarrhea 08/11/2012  . Constipation 07/31/2012  . Abdominal pain 06/30/2012  . Alternating constipation and diarrhea 06/30/2012    Past Surgical History  Procedure Laterality Date  . Cholecystectomy  06/11/09    chronic cholecystitis  . Surgery as a baby      few months old, ?pyloric stenosis  . Liver biopsy  06/11/09    [place]-No cirrhosis or bridging fibrosis, minimal portal lymphocytic inflammation & mild microvesicular steatosis  .  Colonoscopy with propofol  08/30/2012    XV[facility]:MGQQP internal hemorrhoids/Tortuous redundant sigmoid and transverse colon  . Esophagogastroduodenoscopy (egd) with propofol  08/30/2012    Procedure: ESOPHAGOGASTRODUODENOSCOPY (EGD) WITH PROPOFOL;  Surgeon: West Bali, MD;  Location: [facility];  Service: Endoscopy;  Laterality: N/A;  start at 12:11  . Biopsy  08/30/2012    Procedure: BIOPSY;  Surgeon: West Bali, MD;  Location: [facility];  Service: Endoscopy;  Laterality: N/A;  Random colon biopsies; Duodenal biopsies; Gastric Biopsies    Current Outpatient Rx  Name  Route  Sig  Dispense  Refill  . acidophilus (RISAQUAD) CAPS   Oral   Take 1 capsule by mouth daily.         Marland Kitchen albuterol (PROVENTIL HFA;VENTOLIN HFA) 108 (90 BASE) MCG/ACT inhaler   Inhalation   Inhale 1-2 puffs into the lungs every 6 (six) hours as needed for wheezing.   1 Inhaler   0   . baclofen (LIORESAL) 10 MG tablet   Oral   Take 1 tablet (10 mg total) by mouth 3 (three) times daily.   30 tablet   0   . cephALEXin (KEFLEX) 500 MG capsule   Oral   Take 1 capsule (500 mg total) by mouth 2 (two) times daily.   14 capsule   0   . clonazePAM (KLONOPIN) 0.5 MG tablet   Oral   Take 0.5-1 mg by mouth at bedtime. Takes 0.5 mg (1 tab) orally in the  morning and 2 tablets ( ) orally once a day at bedtime.         . Cyanocobalamin (VITAMIN B-12 PO)   Oral   Take 1 tablet by mouth daily.         Marland Kitchen FLUoxetine (PROZAC) 40 MG capsule   Oral   Take 40 mg by mouth daily.         Marland Kitchen gabapentin (NEURONTIN) 300 MG capsule   Oral   Take 300 mg by mouth at bedtime.         Marland Kitchen HYDROcodone-ibuprofen (VICOPROFEN) 7.5-200 MG per tablet   Oral   Take 1 tablet by mouth every 8 (eight) hours as needed. pain         . iloperidone (FANAPT) 4 MG TABS   Oral   Take 2 mg by mouth at bedtime.         Marland Kitchen levothyroxine (SYNTHROID, LEVOTHROID) 75 MCG tablet   Oral   Take 75 mcg by mouth daily before breakfast.          . Linaclotide (LINZESS) 290 MCG CAPS   Oral   Take 290 mcg by mouth daily.         Marland Kitchen lithium carbonate 300 MG capsule   Oral   Take 900 mg by mouth at bedtime.          Lorita Officer Triphasic (TRINESSA, 28, PO)   Oral   Take 1 tablet by mouth daily.         . pantoprazole (PROTONIX) 40 MG tablet   Oral   Take 1 tablet (40 mg total) by mouth daily.   30 tablet   5   . promethazine (PHENERGAN) 25 MG tablet   Oral   Take 25 mg by mouth every 6 (six) hours as needed. nausea         . traZODone (DESYREL) 50 MG tablet   Oral   Take 25 mg by mouth at bedtime.           Allergies Amoxicillin and Penicillins  Family History  Problem Relation Age of Onset  . Adopted: Yes  . Colon cancer Neg Hx     Social History Social History  Substance Use Topics  . Smoking status: Never Smoker   . Smokeless tobacco: Never Used  . Alcohol Use: No    Review of Systems Constitutional: Negative for fever. ENT: Mild congestion Cardiovascular: Intermittent chest pain over the past 5 days Respiratory: Negative for shortness of breath. Positive for cough per patient Gastrointestinal: Positive abdominal pain/cramping. Positive nausea. Negative vomiting or diarrhea Genitourinary: Negative for dysuria. Neurological: Negative for headache 10-point ROS otherwise negative.  ____________________________________________   PHYSICAL EXAM:  VITAL SIGNS: ED Triage Vitals  Enc Vitals Group     BP 01/02/16 2248 131/86 mmHg     Pulse Rate 01/02/16 2248 100     Resp 01/02/16 2248 18     Temp 01/02/16 2248 98.3 F (36.8 C)     Temp Source 01/02/16 2248 Oral     SpO2 01/02/16 2248 99 %     Weight 01/02/16 2248 250 lb (113.399 kg)     Height 01/02/16 2248  (1.676 m)     Head Cir --      Peak Flow --      Pain Score 01/02/16 2249 8     Pain Loc --      Pain Edu? --      Excl. in GC? --  Constitutional: Alert and oriented. Well appearing and in no  distress. Eyes: Normal exam ENT   Head: Normocephalic and atraumatic   Mouth/Throat: Mucous membranes are moist. Cardiovascular: Normal rate, regular rhythm. No murmur Respiratory: Normal respiratory effort without tachypnea nor retractions. Breath sounds are clear Gastrointestinal: Soft, mild mid to lower midline abdominal tenderness palpation. No rebound or guarding. No distention. Musculoskeletal: Nontender with normal range of motion in all extremities. Neurologic:  Normal speech and language. No gross focal neurologic deficits Skin:  Skin is warm, dry and intact.  Psychiatric: Mood and affect are normal. Speech and behavior are normal.  ____________________________________________    EKG  EKG reviewed and interpreted by myself shows normal sinus rhythm at 95 bpm, narrow QRS, normal axis, normal intervals, nonspecific but no concerning ST changes.  ____________________________________________    RADIOLOGY  Chest x-ray shows no acute abnormality  ____________________________________________    INITIAL IMPRESSION / ASSESSMENT AND PLAN / ED COURSE  Pertinent labs & imaging results that were available during my care of the patient were reviewed by me and considered in my medical decision making (see chart for details).  Patient presents the emergency department with complaints of abdominal pain, intermittent chest pain, nausea, occasional cough with congestion and generalized weakness. Overall the patient appears well on exam, minimal periumbilical to lower abdominal tenderness. Denies dysuria. Patient states she feels her symptoms are related to her menstrual period which started 5 days ago. Patient's labs are largely within normal limits besides a slight leukocytosis of 12,000. We'll check a urinalysis, treat the patient's discomfort and nausea as well as IV hydrate the patient. We will closely watched in the emergency department.  Patient's urinalysis is consistent with  a urinary tract infection. We'll place on Macrobid twice a day for 10 days. I have sent a urine culture. We will have the patient follow up with a primary care physician to 3 days for recheck. Patient agreeable to plan.  ____________________________________________   FINAL CLINICAL IMPRESSION(S) / ED DIAGNOSES  Chest pain Abdominal pain   Minna AntisKevin Davius Goudeau, MD 01/03/16 [phone]

## 2016-01-03 NOTE — ED Notes (Signed)

## 2016-01-05 LAB — URINE CULTURE

## 2016-01-29 ENCOUNTER — Emergency Department: Payer: Medicaid Other

## 2016-01-29 ENCOUNTER — Emergency Department
Admission: EM | Admit: 2016-01-29 | Discharge: 2016-01-29 | Disposition: A | Discharge status: Home / Self Care | Payer: Medicaid Other | Attending: Emergency Medicine | Admitting: Emergency Medicine

## 2016-01-29 ENCOUNTER — Encounter: Payer: Self-pay | Admitting: Emergency Medicine

## 2016-01-29 DIAGNOSIS — J45909 Unspecified asthma, uncomplicated: Secondary | ICD-10-CM | POA: Insufficient documentation

## 2016-01-29 DIAGNOSIS — F319 Bipolar disorder, unspecified: Secondary | ICD-10-CM | POA: Diagnosis not present

## 2016-01-29 DIAGNOSIS — Y929 Unspecified place or not applicable: Secondary | ICD-10-CM | POA: Insufficient documentation

## 2016-01-29 DIAGNOSIS — S93402A Sprain of unspecified ligament of left ankle, initial encounter: Secondary | ICD-10-CM | POA: Insufficient documentation

## 2016-01-29 DIAGNOSIS — Z79899 Other long term (current) drug therapy: Secondary | ICD-10-CM | POA: Insufficient documentation

## 2016-01-29 DIAGNOSIS — Y999 Unspecified external cause status: Secondary | ICD-10-CM | POA: Diagnosis not present

## 2016-01-29 DIAGNOSIS — E039 Hypothyroidism, unspecified: Secondary | ICD-10-CM | POA: Diagnosis not present

## 2016-01-29 DIAGNOSIS — F418 Other specified anxiety disorders: Secondary | ICD-10-CM | POA: Insufficient documentation

## 2016-01-29 DIAGNOSIS — W010XXA Fall on same level from slipping, tripping and stumbling without subsequent striking against object, initial encounter: Secondary | ICD-10-CM | POA: Insufficient documentation

## 2016-01-29 DIAGNOSIS — S99912A Unspecified injury of left ankle, initial encounter: Secondary | ICD-10-CM | POA: Diagnosis present

## 2016-01-29 DIAGNOSIS — Y9389 Activity, other specified: Secondary | ICD-10-CM | POA: Diagnosis not present

## 2016-01-29 MED ORDER — MELOXICAM 15 MG PO TABS: 15.0000 mg | ORAL_TABLET | Freq: Every day | ORAL | Status: DC

## 2016-01-29 NOTE — Discharge Instructions (Signed)
Ankle Sprain °An ankle sprain is an injury to the strong, fibrous tissues (ligaments) that hold the bones of your ankle joint together.  °CAUSES °An ankle sprain is usually caused by a fall or by twisting your ankle. Ankle sprains most commonly occur when you step on the outer edge of your foot, and your ankle turns inward. People who participate in sports are more prone to these types of injuries.  °SYMPTOMS  °· Pain in your ankle. The pain may be present at rest or only when you are trying to stand or walk. °· Swelling. °· Bruising. Bruising may develop immediately or within 1 to 2 days after your injury. °· Difficulty standing or walking, particularly when turning corners or changing directions. °DIAGNOSIS  °Your caregiver will ask you details about your injury and perform a physical exam of your ankle to determine if you have an ankle sprain. During the physical exam, your caregiver will press on and apply pressure to specific areas of your foot and ankle. Your caregiver will try to move your ankle in certain ways. An X-ray exam may be done to be sure a bone was not broken or a ligament did not separate from one of the bones in your ankle (avulsion fracture).  °TREATMENT  °Certain types of braces can help stabilize your ankle. Your caregiver can make a recommendation for this. Your caregiver may recommend the use of medicine for pain. If your sprain is severe, your caregiver may refer you to a surgeon who helps to restore function to parts of your skeletal system (orthopedist) or a physical therapist. °HOME CARE INSTRUCTIONS  °· Apply ice to your injury for 1-2 days or as directed by your caregiver. Applying ice helps to reduce inflammation and pain. °· Put ice in a plastic bag. °· Place a towel between your skin and the bag. °· Leave the ice on for 15-20 minutes at a time, every 2 hours while you are awake. °· Only take over-the-counter or prescription medicines for pain, discomfort, or fever as directed by  your caregiver. °· Elevate your injured ankle above the level of your heart as much as possible for 2-3 days. °· If your caregiver recommends crutches, use them as instructed. Gradually put weight on the affected ankle. Continue to use crutches or a cane until you can walk without feeling pain in your ankle. °· If you have a plaster splint, wear the splint as directed by your caregiver. Do not rest it on anything harder than a pillow for the first 24 hours. Do not put weight on it. Do not get it wet. You may take it off to take a shower or bath. °· You may have been given an elastic bandage to wear around your ankle to provide support. If the elastic bandage is too tight (you have numbness or tingling in your foot or your foot becomes cold and blue), adjust the bandage to make it comfortable. °· If you have an air splint, you may blow more air into it or let air out to make it more comfortable. You may take your splint off at night and before taking a shower or bath. Wiggle your toes in the splint several times per day to decrease swelling. °SEEK MEDICAL CARE IF:  °· You have rapidly increasing bruising or swelling. °· Your toes feel extremely cold or you lose feeling in your foot. °· Your pain is not relieved with medicine. °SEEK IMMEDIATE MEDICAL CARE IF: °· Your toes are numb or blue. °·   You have severe pain that is increasing. °MAKE SURE YOU:  °· Understand these instructions. °· Will watch your condition. °· Will get help right away if you are not doing well or get worse. °  °This information is not intended to replace advice given to you by your health care provider. Make sure you discuss any questions you have with your health care provider. °  °Document Released: 10/12/2005 Document Revised: 11/02/2014 Document Reviewed: 10/24/2011 °Elsevier Interactive Patient Education ©2016 [employer] ° °Elastic Bandage and RICE °WHAT DOES AN ELASTIC BANDAGE DO? °Elastic bandages come in different shapes and sizes.  They generally provide support to your injury and reduce swelling while you are healing, but they can perform different functions. Your health care provider will help you to decide what is best for your protection, recovery, or rehabilitation following an injury. °WHAT ARE SOME GENERAL TIPS FOR USING AN ELASTIC BANDAGE? °· Use the bandage as directed by the maker of the bandage that you are using. °· Do not wrap the bandage too tightly. This may cut off the circulation in the arm or leg in the area below the bandage. °· If part of your body beyond the bandage becomes blue, numb, cold, swollen, or is more painful, your bandage is most likely too tight. If this occurs, remove your bandage and reapply it more loosely. °· See your health care provider if the bandage seems to be making your problems worse rather than better. °· An elastic bandage should be removed and reapplied every 3-4 hours or as directed by your health care provider. °WHAT IS RICE? °The routine care of many injuries includes rest, ice, compression, and elevation (RICE therapy).  °Rest °Rest is required to allow your body to heal. Generally, you can resume your routine activities when you are comfortable and have been given permission by your health care provider. °Ice °Icing your injury helps to keep the swelling down and it reduces pain. Do not apply ice directly to your skin. °· Put ice in a plastic bag. °· Place a towel between your skin and the bag. °· Leave the ice on for 20 minutes, 2-3 times per day. °Do this for as long as you are directed by your health care provider. °Compression °Compression helps to keep swelling down, gives support, and helps with discomfort. Compression may be done with an elastic bandage. °Elevation °Elevation helps to reduce swelling and it decreases pain. If possible, your injured area should be placed at or above the level of your heart or the center of your chest. °WHEN SHOULD I SEEK MEDICAL CARE? °You should seek  medical care if: °· You have persistent pain and swelling. °· Your symptoms are getting worse rather than improving. °These symptoms may indicate that further evaluation or further X-rays are needed. Sometimes, X-rays may not show a small broken bone (fracture) until a number of days later. Make a follow-up appointment with your health care provider. Ask when your X-ray results will be ready. Make sure that you get your X-ray results. °WHEN SHOULD I SEEK IMMEDIATE MEDICAL CARE? °You should seek immediate medical care if: °· You have a sudden onset of severe pain at or below the area of your injury. °· You develop redness or increased swelling around your injury. °· You have tingling or numbness at or below the area of your injury that does not improve after you remove the elastic bandage. °  °This information is not intended to replace advice given to you by your   health care provider. Make sure you discuss any questions you have with your health care provider. °  °Document Released: 04/03/2002 Document Revised: 07/03/2015 Document Reviewed: 05/28/2014 °Elsevier Interactive Patient Education ©2016 [employer] ° °Stirrup Ankle Brace °Stirrup ankle braces give support and help stabilize the ankle joint. They are rigid pieces of plastic or fiberglass that go up both sides of the lower leg with the bottom of the stirrup fitting comfortably under the bottom of the instep of the foot. It can be held on with Velcro straps or an elastic wrap. Stirrup ankle braces are used to support the ankle following mild or moderate sprains or strains, or fractures after cast removal.  °They can be easily removed or adjusted if there is swelling. The rigid brace shells are designed to fit the ankle comfortably and provide the needed medial/lateral stabilization. This brace can be easily worn with most athletic shoes. The brace liner is usually made of a soft, comfortable gel-like material. This gel fits the ankle well without causing  uncomfortable pressure points.  °IMPORTANCE OF ANKLE BRACES: °· The use of ankle bracing is effective in the prevention of ankle sprains. °· In athletes, the use of ankle bracing will offer protection and prevent further sprains. °· Research shows that a complete rehabilitation program needs to be included with external bracing. This includes range of motion and ankle strengthening exercises. Your caregivers will instruct you in this. °If you were given the brace today for a new injury, use the following home care instructions as a guide. °HOME CARE INSTRUCTIONS  °· Apply ice to the sore area for 15-20 minutes, 03-04 times per day while awake for the first 2 days. Put the ice in a plastic bag and place a towel between the bag of ice and your skin. Never place the ice pack directly on your skin. Be especially careful using ice on an elbow or knee or other bony area, such as your ankle, because icing for too long may damage the nerves which are close to the surface. °· Keep your leg elevated when possible to lessen swelling. °· Wear your splint until you are seen for a follow-up examination. Do not put weight on it. Do not get it wet. You may take it off to take a shower or bath. °· For Activity: Use crutches with non-weight bearing for 1 week. Then, you may walk on your ankle as instructed. Start gradually with weight bearing on the affected ankle. °· Continue to use crutches or a cane until you can stand on your ankle without causing pain. °· Wiggle your toes in the splint several times per day if you are able. °· The splint is too tight if you have numbness, tingling, or if your foot becomes cold and blue. Adjust the straps or elastic bandage to make it comfortable. °· Only take over-the-counter or prescription medicines for pain, discomfort, or fever as directed by your caregiver. °SEEK IMMEDIATE MEDICAL CARE IF:  °· You have increased bruising, swelling or pain. °· Your toes are blue or cold and loosening the  brace or wrap does not help. °· Your pain is not relieved with medicine. °MAKE SURE YOU:  °· Understand these instructions. °· Will watch your condition. °· Will get help right away if you are not doing well or get worse. °  °This information is not intended to replace advice given to you by your health care provider. Make sure you discuss any questions you have with your health care provider. °  °  Document Released: 08/12/2004 Document Revised: 01/04/2012 Document Reviewed: 05/14/2015 °Elsevier Interactive Patient Education ©2016 [employer] ° °

## 2016-01-29 NOTE — ED Provider Notes (Addendum)
[facility] Emergency Department Provider Note  ____________________________________________  Time seen: Approximately 11:07 PM  I have reviewed the triage vital signs and the nursing notes.   HISTORY  Chief Complaint Ankle Pain    HPI Tracy Petersen is a 25 y.o. female who presents to the emergency department complaining of left ankle pain. Patient states that she was walking today when she slipped on a wet surface. Patient states that she caught herself before she landed on the ground but she twisted her ankle in the process. Patient is now endorsing pain and swelling to the anterolateral aspect of the ankle. Patient states that weightbearing results and significant amount of pain. She states that pain is constant, sharp, worse with weightbearing. Patient denies any numbness or tingling in her toes. She denies any other injury or complaint at this time.   Past Medical History  Diagnosis Date  . Borderline personality disorder   . Bipolar 1 disorder (HCC)   . Acid reflux   . Hypothyroidism   . Depression with anxiety   . Asthma   . Fracture     fracture to rt. knee     Patient Active Problem List   Diagnosis Date Noted  . Depression   . Nausea 08/11/2012  . Diarrhea 08/11/2012  . Constipation 07/31/2012  . Abdominal pain 06/30/2012  . Alternating constipation and diarrhea 06/30/2012    Past Surgical History  Procedure Laterality Date  . Cholecystectomy  06/11/09    chronic cholecystitis  . Surgery as a baby      few months old, ?pyloric stenosis  . Liver biopsy  06/11/09    [place]-No cirrhosis or bridging fibrosis, minimal portal lymphocytic inflammation & mild microvesicular steatosis  . Colonoscopy with propofol  08/30/2012    [facility]:NFAOZ internal hemorrhoids/Tortuous redundant sigmoid and transverse colon  . Esophagogastroduodenoscopy (egd) with propofol  08/30/2012    Procedure: ESOPHAGOGASTRODUODENOSCOPY (EGD) WITH PROPOFOL;  Surgeon:  West Bali, MD;  Location: [facility];  Service: Endoscopy;  Laterality: N/A;  start at 12:11  . Biopsy  08/30/2012    Procedure: BIOPSY;  Surgeon: West Bali, MD;  Location: [facility];  Service: Endoscopy;  Laterality: N/A;  Random colon biopsies; Duodenal biopsies; Gastric Biopsies    Current Outpatient Rx  Name  Route  Sig  Dispense  Refill  . acidophilus (RISAQUAD) CAPS   Oral   Take 1 capsule by mouth daily.         Marland Kitchen albuterol (PROVENTIL HFA;VENTOLIN HFA) 108 (90 BASE) MCG/ACT inhaler   Inhalation   Inhale 1-2 puffs into the lungs every 6 (six) hours as needed for wheezing.   1 Inhaler   0   . baclofen (LIORESAL) 10 MG tablet   Oral   Take 1 tablet (10 mg total) by mouth 3 (three) times daily.   30 tablet   0   . cephALEXin (KEFLEX) 500 MG capsule   Oral   Take 1 capsule (500 mg total) by mouth 2 (two) times daily.   14 capsule   0   . clonazePAM (KLONOPIN) 0.5 MG tablet   Oral   Take 0.5-1 mg by mouth at bedtime. Takes 0.5 mg (1 tab) orally in the morning and 2 tablets ( ) orally once a day at bedtime.         . Cyanocobalamin (VITAMIN B-12 PO)   Oral   Take 1 tablet by mouth daily.         Marland Kitchen FLUoxetine (PROZAC) 40  MG capsule   Oral   Take 40 mg by mouth daily.         Marland Kitchen. gabapentin (NEURONTIN) 300 MG capsule   Oral   Take 300 mg by mouth at bedtime.         Marland Kitchen. HYDROcodone-ibuprofen (VICOPROFEN) 7.5-200 MG per tablet   Oral   Take 1 tablet by mouth every 8 (eight) hours as needed. pain         . iloperidone (FANAPT) 4 MG TABS   Oral   Take 2 mg by mouth at bedtime.         Marland Kitchen. levothyroxine (SYNTHROID, LEVOTHROID) 75 MCG tablet   Oral   Take 75 mcg by mouth daily before breakfast.         . Linaclotide (LINZESS) 290 MCG CAPS   Oral   Take 290 mcg by mouth daily.         Marland Kitchen. lithium carbonate 300 MG capsule   Oral   Take 900 mg by mouth at bedtime.          . meloxicam (MOBIC) 15 MG tablet   Oral   Take 1 tablet (15 mg total) by  mouth daily.   30 tablet   0   . Norgestim-Eth Estrad Triphasic (TRINESSA, 28, PO)   Oral   Take 1 tablet by mouth daily.         . ondansetron (ZOFRAN ODT) 4 MG disintegrating tablet   Oral   Take 1 tablet (4 mg total) by mouth every 8 (eight) hours as needed for nausea or vomiting.   20 tablet   0   . pantoprazole (PROTONIX) 40 MG tablet   Oral   Take 1 tablet (40 mg total) by mouth daily.   30 tablet   5   . promethazine (PHENERGAN) 25 MG tablet   Oral   Take 25 mg by mouth every 6 (six) hours as needed. nausea         . traMADol (ULTRAM) 50 MG tablet   Oral   Take 1 tablet (50 mg total) by mouth every 6 (six) hours as needed.   10 tablet   0   . traZODone (DESYREL) 50 MG tablet   Oral   Take 25 mg by mouth at bedtime.           Allergies Amoxicillin and Penicillins  Family History  Problem Relation Age of Onset  . Adopted: Yes  . Colon cancer Neg Hx     Social History Social History  Substance Use Topics  . Smoking status: Never Smoker   . Smokeless tobacco: Never Used  . Alcohol Use: No     Review of Systems  Constitutional: No fever/chills Cardiovascular: no chest pain. Respiratory:  No SOB. Musculoskeletal: Negative for Left back pain, Hip pain, knee pain. Patient endorses pain to the left ankle. Skin: Negative for rash. Neurological: Negative for headaches, focal weakness or numbness. 10-point ROS otherwise negative.  ____________________________________________   PHYSICAL EXAM:  VITAL SIGNS: ED Triage Vitals  Enc Vitals Group     BP 01/29/16 2048 112/82 mmHg     Pulse Rate 01/29/16 2048 92     Resp 01/29/16 2048 18     Temp 01/29/16 2048 98.1 F (36.7 C)     Temp Source 01/29/16 2048 Oral     SpO2 01/29/16 2048 96 %     Weight 01/29/16 2048 250 lb (113.399 kg)     Height 01/29/16 2048 5\' 6"  (1.676  m)     Head Cir --      Peak Flow --      Pain Score 01/29/16 2049 7     Pain Loc --      Pain Edu? --      Excl. in GC?  --      Constitutional: Alert and oriented. Well appearing and in no acute distress. Eyes: Conjunctivae are normal. PERRL. EOMI. Head: Atraumatic. Cardiovascular: Normal rate, regular rhythm. Normal S1 and S2.  Good peripheral circulation. Respiratory: Normal respiratory effort without tachypnea or retractions. Lungs CTAB. Musculoskeletal: No deformity noted to left ankle. Significant edema is noted to the anterior and lateral aspects of the left ankle. Patient has limited range of motion due to pain. Patient is tender to palpation diffusely over the entire ankle joint. No point tenderness. No palpable abnormality. Dorsalis pedis pulses intact. Sensation intact distal 5 digits. Neurologic:  Normal speech and language. No gross focal neurologic deficits are appreciated.  Skin:  Skin is warm, dry and intact. No rash noted. Psychiatric: Mood and affect are normal. Speech and behavior are normal. Patient exhibits appropriate insight and judgement.   ____________________________________________   LABS (all labs ordered are listed, but only abnormal results are displayed)  Labs Reviewed - No data to display ____________________________________________  EKG   ____________________________________________  RADIOLOGY Festus Barren Mikenzie Mccannon, personally viewed and evaluated these images (plain radiographs) as part of my medical decision making, as well as reviewing the written report by the radiologist.  Dg Ankle Complete Left  01/29/2016  CLINICAL DATA:  Pain and swelling after slipping earlier today in the rain. EXAM: LEFT ANKLE COMPLETE - 3+ VIEW COMPARISON:  None. FINDINGS: There is lateral malleolar soft tissue swelling. No fracture is evident. The mortise is symmetric. There is no radiopaque foreign body. IMPRESSION: Negative for acute fracture Electronically Signed   By: Ellery Plunk M.D.   On: 01/29/2016 21:19     ____________________________________________    PROCEDURES  Procedure(s) performed:    SPLINT APPLICATION Date/Time: 2330 Authorized by: Delorise Royals Bryden Darden Consent: Verbal consent obtained. Risks and benefits: risks, benefits and alternatives were discussed Consent given by: patient Splint applied by: ED tech Location details: Left ankle  Splint type: Stirrup ankle brace  Supplies used: Prefabricated Ortho-Glass stirrup ankle brace.  Post-procedure: The splinted body part was neurovascularly unchanged following the procedure. Patient tolerance: Patient tolerated the procedure well with no immediate complications.      Medications - No data to display   ____________________________________________   INITIAL IMPRESSION / ASSESSMENT AND PLAN / ED COURSE  Pertinent labs & imaging results that were available during my care of the patient were reviewed by me and considered in my medical decision making (see chart for details).  Patient's diagnosis is consistent with left ankle sprain. X-rays reveal no acute osseous abnormality. Patient is splinted here in the emergency department and given crutches for ambulation. Patient is prescribed anti-inflammatories for symptom control and is instructed in the use of the RICE method.  Patient is to follow up with orthopedics if symptoms persist past this treatment course. Patient is given ED precautions to return to the ED for any worsening or new symptoms.     ____________________________________________  FINAL CLINICAL IMPRESSION(S) / ED DIAGNOSES  Final diagnoses:  Ankle sprain, left, initial encounter      NEW MEDICATIONS STARTED DURING THIS VISIT:  Discharge Medication List as of 01/29/2016 11:19 PM    START taking these medications   Details  meloxicam (MOBIC) 15 MG tablet Take 1 tablet (15 mg total) by mouth daily., Starting 01/29/2016, Until Discontinued, Print            This chart was dictated using  voice recognition software/Dragon. Despite best efforts to proofread, errors can occur which can change the meaning. Any change was purely unintentional.    Racheal Patches, PA-C 01/30/16 0038  Myrna Blazer, MD 01/31/16 1633  Delorise Royals Camari Quintanilla, PA-C 02/19/16 1817  Myrna Blazer, MD 02/22/16 [phone]

## 2016-01-29 NOTE — ED Notes (Signed)
Pt comes into the ED via POV c/o left ankle pain.  Patient states she has been having difficulties with this ankle, but she slipped earlier today in the rain and now it has increased in pain and swelling.  Patient states she is having a hard time bearing weight on that ankle.  Patient in NAD at this time.

## 2016-02-01 ENCOUNTER — Encounter: Payer: Self-pay | Admitting: Emergency Medicine

## 2016-02-01 ENCOUNTER — Emergency Department
Admission: EM | Admit: 2016-02-01 | Discharge: 2016-02-01 | Disposition: A | Discharge status: Home / Self Care | Payer: Medicaid Other | Attending: Emergency Medicine | Admitting: Emergency Medicine

## 2016-02-01 DIAGNOSIS — Y9289 Other specified places as the place of occurrence of the external cause: Secondary | ICD-10-CM | POA: Insufficient documentation

## 2016-02-01 DIAGNOSIS — Y999 Unspecified external cause status: Secondary | ICD-10-CM | POA: Insufficient documentation

## 2016-02-01 DIAGNOSIS — E039 Hypothyroidism, unspecified: Secondary | ICD-10-CM | POA: Insufficient documentation

## 2016-02-01 DIAGNOSIS — S93402A Sprain of unspecified ligament of left ankle, initial encounter: Secondary | ICD-10-CM | POA: Diagnosis not present

## 2016-02-01 DIAGNOSIS — J45909 Unspecified asthma, uncomplicated: Secondary | ICD-10-CM | POA: Diagnosis not present

## 2016-02-01 DIAGNOSIS — W010XXA Fall on same level from slipping, tripping and stumbling without subsequent striking against object, initial encounter: Secondary | ICD-10-CM | POA: Diagnosis not present

## 2016-02-01 DIAGNOSIS — F319 Bipolar disorder, unspecified: Secondary | ICD-10-CM | POA: Insufficient documentation

## 2016-02-01 DIAGNOSIS — M25572 Pain in left ankle and joints of left foot: Secondary | ICD-10-CM | POA: Diagnosis present

## 2016-02-01 DIAGNOSIS — K219 Gastro-esophageal reflux disease without esophagitis: Secondary | ICD-10-CM | POA: Diagnosis not present

## 2016-02-01 DIAGNOSIS — Y939 Activity, unspecified: Secondary | ICD-10-CM | POA: Diagnosis not present

## 2016-02-01 DIAGNOSIS — S93402D Sprain of unspecified ligament of left ankle, subsequent encounter: Secondary | ICD-10-CM

## 2016-02-01 MED ORDER — TRAMADOL HCL 50 MG PO TABS
50.0000 mg | ORAL_TABLET | Freq: Once | ORAL | Status: AC
  Administered 2016-02-01: 50 mg via ORAL
  Filled 2016-02-01: qty 1

## 2016-02-01 MED ORDER — TRAMADOL HCL 50 MG PO TABS: 50.0000 mg | ORAL_TABLET | Freq: Four times a day (QID) | ORAL | Status: DC | PRN

## 2016-02-01 NOTE — ED Notes (Signed)
Pt seen here a few days ago after injuring left ankle; slipped in a puddle; returns tonight with increased pain and swelling; ankle feels weak

## 2016-02-01 NOTE — ED Provider Notes (Signed)
[facility] Emergency Department Provider Note  ____________________________________________  Time seen: Approximately 10:24 PM  I have reviewed the triage vital signs and the nursing notes.   HISTORY  Chief Complaint Ankle Pain   HPI Tracy Petersen is a 25 y.o. female patient complaining of continue left ankle pain status post slip and fall 2 days ago. Patient was seen in this ED and x-rays a negative. Patient was placed in the ankle stirrup splint and given crutches for ambulation. Patient states she is scared to ambulate with crutches. Patient is asked for a walking boot. Advised patient we do not issue walking boots from the ED department and must be from orthopedic clinic. Patient rating the pain as a 10 over 10. Patient described a pain as sharp. Patient also stated this increased laxity in the left ankle.  Past Medical History  Diagnosis Date  . Borderline personality disorder   . Bipolar 1 disorder (HCC)   . Acid reflux   . Hypothyroidism   . Depression with anxiety   . Asthma   . Fracture     fracture to rt. knee     Patient Active Problem List   Diagnosis Date Noted  . Depression   . Nausea 08/11/2012  . Diarrhea 08/11/2012  . Constipation 07/31/2012  . Abdominal pain 06/30/2012  . Alternating constipation and diarrhea 06/30/2012    Past Surgical History  Procedure Laterality Date  . Cholecystectomy  06/11/09    chronic cholecystitis  . Surgery as a baby      few months old, ?pyloric stenosis  . Liver biopsy  06/11/09    [place]-No cirrhosis or bridging fibrosis, minimal portal lymphocytic inflammation & mild microvesicular steatosis  . Colonoscopy with propofol  08/30/2012    [facility]:Small internal hemorrhoids/Tortuous redundant sigmoid and transverse colon  . Esophagogastroduodenoscopy (egd) with propofol  08/30/2012    Procedure: ESOPHAGOGASTRODUODENOSCOPY (EGD) WITH PROPOFOL;  Surgeon: West BaliSandi L Fields, MD;  Location: [facility];  Service:  Endoscopy;  Laterality: N/A;  start at 12:11  . Biopsy  08/30/2012    Procedure: BIOPSY;  Surgeon: West BaliSandi L Fields, MD;  Location: [facility];  Service: Endoscopy;  Laterality: N/A;  Random colon biopsies; Duodenal biopsies; Gastric Biopsies    Current Outpatient Rx  Name  Route  Sig  Dispense  Refill  . ranitidine (ZANTAC) 150 MG tablet   Oral   Take 150 mg by mouth daily.         Marland Kitchen. acidophilus (RISAQUAD) CAPS   Oral   Take 1 capsule by mouth daily.         Marland Kitchen. albuterol (PROVENTIL HFA;VENTOLIN HFA) 108 (90 BASE) MCG/ACT inhaler   Inhalation   Inhale 1-2 puffs into the lungs every 6 (six) hours as needed for wheezing.   1 Inhaler   0   . baclofen (LIORESAL) 10 MG tablet   Oral   Take 1 tablet (10 mg total) by mouth 3 (three) times daily.   30 tablet   0   . cephALEXin (KEFLEX) 500 MG capsule   Oral   Take 1 capsule (500 mg total) by mouth 2 (two) times daily.   14 capsule   0   . clonazePAM (KLONOPIN) 0.5 MG tablet   Oral   Take 0.5-1 mg by mouth at bedtime. Takes 0.5 mg (1 tab) orally in the morning and 2 tablets (1mg ) orally once a day at bedtime.         . Cyanocobalamin (VITAMIN B-12 PO)  Oral   Take 1 tablet by mouth daily.         Marland Kitchen FLUoxetine (PROZAC) 40 MG capsule   Oral   Take 40 mg by mouth daily.         Marland Kitchen gabapentin (NEURONTIN) 300 MG capsule   Oral   Take 300 mg by mouth at bedtime.         Marland Kitchen HYDROcodone-ibuprofen (VICOPROFEN) 7.5-200 MG per tablet   Oral   Take 1 tablet by mouth every 8 (eight) hours as needed. pain         . iloperidone (FANAPT) 4 MG TABS   Oral   Take 2 mg by mouth at bedtime.         Marland Kitchen levothyroxine (SYNTHROID, LEVOTHROID) 75 MCG tablet   Oral   Take 75 mcg by mouth daily before breakfast.         . Linaclotide (LINZESS) 290 MCG CAPS   Oral   Take 290 mcg by mouth daily.         Marland Kitchen lithium carbonate 300 MG capsule   Oral   Take 900 mg by mouth at bedtime.          . meloxicam (MOBIC) 15 MG tablet    Oral   Take 1 tablet (15 mg total) by mouth daily.   30 tablet   0   . Norgestim-Eth Estrad Triphasic (TRINESSA, 28, PO)   Oral   Take 1 tablet by mouth daily.         . ondansetron (ZOFRAN ODT) 4 MG disintegrating tablet   Oral   Take 1 tablet (4 mg total) by mouth every 8 (eight) hours as needed for nausea or vomiting.   20 tablet   0   . pantoprazole (PROTONIX) 40 MG tablet   Oral   Take 1 tablet (40 mg total) by mouth daily.   30 tablet   5   . promethazine (PHENERGAN) 25 MG tablet   Oral   Take 25 mg by mouth every 6 (six) hours as needed. nausea         . traMADol (ULTRAM) 50 MG tablet   Oral   Take 1 tablet (50 mg total) by mouth every 6 (six) hours as needed.   10 tablet   0   . traMADol (ULTRAM) 50 MG tablet   Oral   Take 1 tablet (50 mg total) by mouth every 6 (six) hours as needed for moderate pain.   12 tablet   0   . traZODone (DESYREL) 50 MG tablet   Oral   Take 25 mg by mouth at bedtime.           Allergies Amoxicillin and Penicillins  Family History  Problem Relation Age of Onset  . Adopted: Yes  . Colon cancer Neg Hx     Social History Social History  Substance Use Topics  . Smoking status: Never Smoker   . Smokeless tobacco: Never Used  . Alcohol Use: No    Review of Systems Constitutional: No fever/chills Eyes: No visual changes. ENT: No sore throat. Cardiovascular: Denies chest pain. Respiratory: Denies shortness of breath. Gastrointestinal: No abdominal pain.  No nausea, no vomiting.  No diarrhea.  No constipation. Genitourinary: Negative for dysuria. Musculoskeletal:Left ankle pain  Skin: Negative for rash. Neurological: Negative for headaches, focal weakness or numbness. Psychiatric:Depression, bipolar, and anxiety Endocrine:Hypothyroidism Hematological/Lymphatic: Allergic/Immunilogical: Penicillin  10-point ROS otherwise negative.  ____________________________________________   PHYSICAL EXAM:  VITAL  SIGNS: ED Triage  Vitals  Enc Vitals Group     BP 02/01/16 2205 130/71 mmHg     Pulse Rate 02/01/16 2205 98     Resp 02/01/16 2205 18     Temp 02/01/16 2205 98.4 F (36.9 C)     Temp Source 02/01/16 2205 Oral     SpO2 02/01/16 2205 97 %     Weight 02/01/16 2205 250 lb (113.399 kg)     Height 02/01/16 2205  (1.676 m)     Head Cir --      Peak Flow --      Pain Score 02/01/16 2207 10     Pain Loc --      Pain Edu? --      Excl. in GC? --     Constitutional: Alert and oriented. Well appearing and in no acute distress. Eyes: Conjunctivae are normal. PERRL. EOMI. Head: Atraumatic. Nose: No congestion/rhinnorhea. Mouth/Throat: Mucous membranes are moist.  Oropharynx non-erythematous. Neck: No stridor.  No cervical spine tenderness to palpation. Hematological/Lymphatic/Immunilogical: No cervical lymphadenopathy. Cardiovascular: Normal rate, regular rhythm. Grossly normal heart sounds.  Good peripheral circulation. Respiratory: Normal respiratory effort.  No retractions. Lungs CTAB. Gastrointestinal: Soft and nontender. No distention. No abdominal bruits. No CVA tenderness. Musculoskeletal: No lower extremity tenderness nor edema.  No joint effusions. Neurologic:  Normal speech and language. No gross focal neurologic deficits are appreciated. No gait instability. Skin:  Skin is warm, dry and intact. No rash noted. Psychiatric: Mood and affect are normal. Speech and behavior are normal.  ____________________________________________   LABS (all labs ordered are listed, but only abnormal results are displayed)  Labs Reviewed - No data to display ____________________________________________  EKG   ____________________________________________  RADIOLOGY   ____________________________________________   PROCEDURES  Procedure(s) performed: None  Critical Care performed: No  ____________________________________________   INITIAL IMPRESSION / ASSESSMENT AND PLAN / ED  COURSE  Pertinent labs & imaging results that were available during my care of the patient were reviewed by me and considered in my medical decision making (see chart for details).  Sprain ankle. Reviewed x-rays showing no acute findings. Advised patient to continue wearing splint to ambulate with crutches. Patient advised follow-up for orthopedics secondary to complain of laxity of the ankle. ____________________________________________   FINAL CLINICAL IMPRESSION(S) / ED DIAGNOSES  Final diagnoses:  Sprain of left ankle, subsequent encounter      Joni Reining, PA-C 02/01/16 2244  Emily Filbert, MD 02/01/16 [phone]

## 2016-02-01 NOTE — Discharge Instructions (Signed)
Advised to continue wearing ankle support and follow orthopedics by calling for an appointment on Monday.

## 2016-02-04 ENCOUNTER — Encounter: Payer: Self-pay | Admitting: *Deleted

## 2016-02-05 ENCOUNTER — Other Ambulatory Visit: Payer: Self-pay

## 2016-02-20 ENCOUNTER — Encounter: Payer: Self-pay | Admitting: Urology

## 2016-02-20 ENCOUNTER — Ambulatory Visit (INDEPENDENT_AMBULATORY_CARE_PROVIDER_SITE_OTHER): Payer: Medicaid Other | Admitting: Urology

## 2016-02-20 VITALS — BP 115/79 | HR 91 | Ht 66.0 in | Wt 254.0 lb

## 2016-02-20 DIAGNOSIS — R310 Gross hematuria: Secondary | ICD-10-CM

## 2016-02-20 DIAGNOSIS — N390 Urinary tract infection, site not specified: Secondary | ICD-10-CM | POA: Diagnosis not present

## 2016-02-20 LAB — BLADDER SCAN AMB NON-IMAGING: SCAN RESULT: 79

## 2016-02-20 NOTE — Progress Notes (Signed)
02/20/2016 8:23 AM   Rueben Bash 19-Feb-1991 [id number]  Referring provider: [facility] [address] MA[address] [place], [place] [address]  Chief Complaint  Patient presents with  . Recurrent UTI    referred by [facility]    HPI: Patient is a 25 year old Caucasian female who is referred by her primary care provider, Dr. Elyse Hsu, for recurrent urinary tract infections.  She states that for the last 5 years she has been daily with frequent UT'Is and yeast infections.  Her symptoms at the time of infection are dysuria, frequency, pain and her sides, lower back ache and nausea. She states that she had a cystoscopy 2011 with [facility] was not found to have any abnormalities. She was then placed on a small dose of Bactrim daily. When she returns to see her primary care provider few months ago, she was taken off the Bactrim. She states that while she was on the suppressive antibiotic she did not experience urinary tract infections, but now that she is off the suppressive antibiotics for UTI's have returned.  She states her baseline urinary symptoms consist of urinary frequency, stress and urge incontinence. She states that she has also had episodes of gross hematuria. She does not have a history of nephrolithiasis. She is adopted, so she is unaware of her family history. She alternates between diarrhea and constipation. She is drinking 2-3 L of bottles of water daily.    I do not have a documented urinary tract infection at this time. Her UA today is positive for 3-10 RBCs and 6-10 WBCs per high-power field.  Her PVR today is 79 mL.  She does not have a prior history of nephrolithiasis, trauma to the genitourinary tract or malignancies of the genitourinary tract.   Today, she is having symptoms of frequent urination, dysuria and incontinence.   She not experiencing any suprapubic pain, abdominal pain or flank pain. She denies any recent fevers, chills, nausea  or vomiting.   She underwent a CT renal stone study on 11/14/2015 and it was unremarkable.  She is not a smoker.   She is not exposed to secondhand smoke.  She has not worked with Personnel officer.    PMH: Past Medical History  Diagnosis Date  . Borderline personality disorder   . Bipolar 1 disorder (HCC)   . Acid reflux   . Hypothyroidism   . Depression with anxiety   . Asthma   . Fracture     fracture to rt. knee   . Recurrent UTI   . Dysuria   . Knee pain, right     likely patellofemoral syndrome  . Tachycardia   . GERD (gastroesophageal reflux disease)   . Vitamin B 12 deficiency   . Shingles   . Chronic cough   . Drug therapy     Lithium  . Obesity   . Prediabetes   . Microscopic hematuria   . IBS (irritable bowel syndrome)   . Gastritis   . Pyloric stenosis   . Constipation   . AR (allergic rhinitis)   . Contraceptive management     Surgical History: Past Surgical History  Procedure Laterality Date  . Cholecystectomy  06/11/09    chronic cholecystitis  . Surgery as a baby      few months old, ?pyloric stenosis  . Liver biopsy  06/11/09    [place]-No cirrhosis or bridging fibrosis, minimal portal lymphocytic inflammation & mild microvesicular steatosis  . Colonoscopy with propofol  08/30/2012    [facility]:YQMVH internal hemorrhoids/Tortuous redundant sigmoid and transverse colon  . Esophagogastroduodenoscopy (egd) with propofol  08/30/2012    Procedure: ESOPHAGOGASTRODUODENOSCOPY (EGD) WITH PROPOFOL;  Surgeon: West Bali, MD;  Location: [facility];  Service: Endoscopy;  Laterality: N/A;  start at 12:11  . Biopsy  08/30/2012    Procedure: BIOPSY;  Surgeon: West Bali, MD;  Location: [facility];  Service: Endoscopy;  Laterality: N/A;  Random colon biopsies; Duodenal biopsies; Gastric Biopsies    Home Medications:    Medication List       This list is accurate as of: 02/20/16 11:59 PM.  Always use your most recent med list.               acidophilus Caps  capsule  Take 1 capsule by mouth daily. Reported on 02/20/2016     albuterol 108 (90 Base) MCG/ACT inhaler  Commonly known as:  PROVENTIL HFA;VENTOLIN HFA  Inhale 1-2 puffs into the lungs every 6 (six) hours as needed for wheezing.     baclofen 10 MG tablet  Commonly known as:  LIORESAL  Take 1 tablet (10 mg total) by mouth 3 (three) times daily.     cephALEXin 500 MG capsule  Commonly known as:  KEFLEX  Take 1 capsule (500 mg total) by mouth 2 (two) times daily.     clonazePAM 0.5 MG tablet  Commonly known as:  KLONOPIN  Take 0.5-1 mg by mouth at bedtime. Takes 0.5 mg (1 tab) orally in the morning and 2 tablets ( ) orally once a day at bedtime.     FANAPT 4 MG Tabs tablet  Generic drug:  iloperidone  Take 2 mg by mouth at bedtime. Reported on 02/20/2016     FLUoxetine 40 MG capsule  Commonly known as:  PROZAC  Take 40 mg by mouth daily.     gabapentin 300 MG capsule  Commonly known as:  NEURONTIN  Take 300 mg by mouth at bedtime.     HYDROcodone-ibuprofen 7.5-200 MG tablet  Commonly known as:  VICOPROFEN  Take 1 tablet by mouth every 8 (eight) hours as needed. Reported on 02/20/2016     levothyroxine 75 MCG tablet  Commonly known as:  SYNTHROID, LEVOTHROID  Take 75 mcg by mouth daily before breakfast.     LINZESS 290 MCG Caps capsule  Generic drug:  linaclotide  Take 290 mcg by mouth daily. Reported on 02/20/2016     lithium carbonate 300 MG capsule  Take 900 mg by mouth at bedtime.     meloxicam 15 MG tablet  Commonly known as:  MOBIC  Take 1 tablet (15 mg total) by mouth daily.     ondansetron 4 MG disintegrating tablet  Commonly known as:  ZOFRAN ODT  Take 1 tablet (4 mg total) by mouth every 8 (eight) hours as needed for nausea or vomiting.     pantoprazole 40 MG tablet  Commonly known as:  PROTONIX  Take 1 tablet (40 mg total) by mouth daily.     promethazine 25 MG tablet  Commonly known as:  PHENERGAN  Take 25 mg by mouth every 6 (six) hours as needed.  Reported on 02/20/2016     ranitidine 150 MG tablet  Commonly known as:  ZANTAC  Take 150 mg by mouth daily.     traMADol 50 MG tablet  Commonly known as:  ULTRAM  Take 1 tablet (50 mg total) by mouth every 6 (six) hours as needed.  traMADol 50 MG tablet  Commonly known as:  ULTRAM  Take 1 tablet (50 mg total) by mouth every 6 (six) hours as needed for moderate pain.     traZODone 50 MG tablet  Commonly known as:  DESYREL  Take 25 mg by mouth at bedtime.     TRINESSA (28) PO  Take 1 tablet by mouth daily.     VITAMIN B-12 PO  Take 1 tablet by mouth daily. Reported on 02/20/2016        Allergies:  Allergies  Allergen Reactions  . Amoxicillin Other (See Comments)    Unknown reaction not indicated on mar.  . Penicillins Itching and Rash    Red rash and itching. Has patient had a PCN reaction causing immediate rash, facial/tongue/throat swelling, SOB or lightheadedness with hypotension: Yes Has patient had a PCN reaction causing severe rash involving mucus membranes or skin necrosis: No Has patient had a PCN reaction that required hospitalization Yes not sure  Has patient had a PCN reaction occurring within the last 10 years: Yes not sure If all of the above answers are "NO", then may proceed with Cephalospo    Family History: Family History  Problem Relation Age of Onset  . Adopted: Yes  . Colon cancer Neg Hx   . Kidney disease Neg Hx   . Bladder Cancer Neg Hx     Social History:  reports that she has never smoked. She has never used smokeless tobacco. She reports that she does not drink alcohol or use illicit drugs.  ROS: UROLOGY Frequent Urination?: Yes Hard to postpone urination?: No Burning/pain with urination?: Yes Get up at night to urinate?: No Leakage of urine?: Yes Urine stream starts and stops?: No Trouble starting stream?: No Do you have to strain to urinate?: No Blood in urine?: No Urinary tract infection?: Yes Sexually transmitted disease?:  No Injury to kidneys or bladder?: No Painful intercourse?: No Weak stream?: No Currently pregnant?: No Vaginal bleeding?: No Last menstrual period?: [id number]  Gastrointestinal Nausea?: Yes Vomiting?: No Indigestion/heartburn?: Yes Diarrhea?: No Constipation?: No  Constitutional Fever: No Night sweats?: No Weight loss?: No Fatigue?: Yes  Skin Skin rash/lesions?: No Itching?: No  Eyes Blurred vision?: No Double vision?: No  Ears/Nose/Throat Sore throat?: No Sinus problems?: No  Hematologic/Lymphatic Swollen glands?: No Easy bruising?: No  Cardiovascular Leg swelling?: No Chest pain?: Yes  Respiratory Cough?: No Shortness of breath?: No  Endocrine Excessive thirst?: Yes  Musculoskeletal Back pain?: Yes Joint pain?: No  Neurological Headaches?: Yes Dizziness?: Yes  Psychologic Depression?: Yes Anxiety?: Yes  Physical Exam: BP 115/79 mmHg  Pulse 91  Ht 5\' 6"  (1.676 m)  Wt 254 lb (115.214 kg)  BMI 41.02 kg/m2  LMP 01/24/2016 (Approximate)  Constitutional: Well nourished. Alert and oriented, No acute distress. HEENT: [place] AT, moist mucus membranes. Trachea midline, no masses. Cardiovascular: No clubbing, cyanosis, or edema. Respiratory: Normal respiratory effort, no increased work of breathing. GI: Abdomen is soft, non tender, non distended, no abdominal masses. Liver and spleen not palpable.  No hernias appreciated.  Stool sample for occult testing is not indicated.   GU: No CVA tenderness.  No bladder fullness or masses.  Normal external genitalia, normal pubic hair distribution, no lesions.  Normal urethral meatus, no lesions, no prolapse, no discharge.   No urethral masses, tenderness and/or tenderness. No bladder fullness, tenderness or masses. Normal vagina mucosa, good estrogen effect, no discharge, no lesions, good pelvic support, no cystocele or rectocele noted.  No cervical motion  tenderness.  Uterus is freely mobile and non-fixed.  No  adnexal/parametria masses or tenderness noted.  Anus and perineum are without rashes or lesions.    Skin: No rashes, bruises or suspicious lesions. Lymph: No cervical or inguinal adenopathy. Neurologic: Grossly intact, no focal deficits, moving all 4 extremities. Psychiatric: Normal mood and affect.  Laboratory Data: Lab Results  Component Value Date   WBC 12.1* 01/02/2016   HGB 12.5 01/02/2016   HCT 38.4 01/02/2016   MCV 86.6 01/02/2016   PLT 309 01/02/2016    Lab Results  Component Value Date   CREATININE 0.88 01/02/2016     Lab Results  Component Value Date   TSH 6.030* 08/11/2012    Lab Results  Component Value Date   AST 14* 11/14/2015   Lab Results  Component Value Date   ALT 18 11/14/2015     Urinalysis Results for orders placed or performed in visit on 02/20/16  CULTURE, URINE COMPREHENSIVE  Result Value Ref Range   Urine Culture, Comprehensive Final report    Result 1 Comment   GC/Chlamydia Probe Amp  Result Value Ref Range   Chlamydia trachomatis, NAA Negative Negative   Neisseria gonorrhoeae by PCR Negative Negative  Microscopic Examination  Result Value Ref Range   WBC, UA 6-10 (A) 0 -  5 /hpf   RBC, UA 3-10 (A) 0 -  2 /hpf   Epithelial Cells (non renal) >10 (A) 0 - 10 /hpf   Bacteria, UA Few (A) None seen/Few  Urinalysis, Complete  Result Value Ref Range   Specific Gravity, UA 1.015 1.005 - 1.030   pH, UA 7.5 5.0 - 7.5   Color, UA Yellow Yellow   Appearance Ur Cloudy (A) Clear   Leukocytes, UA 1+ (A) Negative   Protein, UA Negative Negative/Trace   Glucose, UA Negative Negative   Ketones, UA Negative Negative   RBC, UA 2+ (A) Negative   Bilirubin, UA Negative Negative   Urobilinogen, Ur 0.2 0.2 - 1.0 mg/dL   Nitrite, UA Negative Negative   Microscopic Examination See below:   BLADDER SCAN AMB NON-IMAGING  Result Value Ref Range   Scan Result 79     Pertinent Imaging: CLINICAL DATA: Acute onset of bilateral flank pain and  vaginal itching. Dark brown discharge. Intermittent nausea and loss of appetite. Initial encounter.  EXAM: CT ABDOMEN AND PELVIS WITHOUT CONTRAST  TECHNIQUE: Multidetector CT imaging of the abdomen and pelvis was performed following the standard protocol without IV contrast.  COMPARISON: CT of the abdomen and pelvis from 06/21/2012  FINDINGS: The visualized lung bases are clear.  The liver and spleen are unremarkable in appearance. The patient is status post cholecystectomy, with clips noted at the gallbladder fossa. The pancreas and adrenal glands are unremarkable.  The kidneys are unremarkable in appearance. There is no evidence of hydronephrosis. No renal or ureteral stones are seen. No perinephric stranding is appreciated.  No free fluid is identified. The small bowel is unremarkable in appearance. The stomach is within normal limits. No acute vascular abnormalities are seen. A circumaortic left renal vein is noted.  The appendix is normal in caliber, without evidence of appendicitis. The colon is unremarkable in appearance.  The bladder is mildly distended and grossly unremarkable. The uterus is unremarkable in appearance. The ovaries are relatively symmetric. No suspicious adnexal masses are seen. No inguinal lymphadenopathy is seen.  No acute osseous abnormalities are identified.  IMPRESSION: Unremarkable noncontrast CT of the abdomen and pelvis.   Electronically Signed  By: Roanna RaiderJeffery Chang M.D.  On: 11/15/2015 00:12  Results for Rueben BashBERGER, Rubby W (MRN [id number]) as of 02/25/2016 23:36  Ref. Range 02/20/2016 16:15  Scan Result Unknown 79    Assessment & Plan:    1. Recurrent UTI:   I do not have any documented urinary tract infections available to me at this time. [facility].  We will send today's urine for culture. Follow-up will be based on culture results and after the  reveal of medical records  - Urinalysis, Complete - CULTURE, URINE COMPREHENSIVE - GC/Chlamydia Probe Amp - BLADDER SCAN AMB NON-IMAGING  2. Gross hematuria:   I will send today's urine for culture. It does contain 3-10 RBCs per high-power field. Follow-up is pending urine culture results and the reveal of records from [facility] and [facility].   Return for pending urine cultures.  These notes generated with voice recognition software. I apologize for typographical errors.  Cloretta NedSHANNON Joanne Brander, PA-C  [facility] [address], [address] [place], [place] [id number] [phone]  Addendum:   Review of her records from other providers have not demonstrated any documented urinary tract infection. Her culture from this visit was negative for infection.  As were her GC and chlamydia cultures.  She has not had a formal hematuria workup.  We will pursue CT urogram and cystoscopy at this time.

## 2016-02-21 LAB — MICROSCOPIC EXAMINATION

## 2016-02-21 LAB — URINALYSIS, COMPLETE
BILIRUBIN UA: NEGATIVE
Glucose, UA: NEGATIVE
Ketones, UA: NEGATIVE
Nitrite, UA: NEGATIVE
PH UA: 7.5 (ref 5.0–7.5)
Protein, UA: NEGATIVE
Specific Gravity, UA: 1.015 (ref 1.005–1.030)
UUROB: 0.2 mg/dL (ref 0.2–1.0)

## 2016-02-22 LAB — CULTURE, URINE COMPREHENSIVE

## 2016-02-22 LAB — GC/CHLAMYDIA PROBE AMP
CHLAMYDIA, DNA PROBE: NEGATIVE
NEISSERIA GONORRHOEAE BY PCR: NEGATIVE

## 2016-02-24 ENCOUNTER — Telehealth: Payer: Self-pay

## 2016-02-24 NOTE — Telephone Encounter (Signed)
-----   Message from Harle BattiestShannon A McGowan, PA-C sent at 02/23/2016  5:47 PM EDT ----- Our cultures are negative. I'm still waiting for medical records from her other providers.

## 2016-02-24 NOTE — Telephone Encounter (Signed)
Spoke with pt in reference to labs. Made aware still waiting for records from other providers. Pt voiced understanding.

## 2016-02-25 ENCOUNTER — Telehealth: Payer: Self-pay | Admitting: Urology

## 2016-02-25 DIAGNOSIS — N390 Urinary tract infection, site not specified: Secondary | ICD-10-CM | POA: Insufficient documentation

## 2016-02-25 DIAGNOSIS — R310 Gross hematuria: Secondary | ICD-10-CM | POA: Insufficient documentation

## 2016-02-25 NOTE — Telephone Encounter (Signed)
I do not find a positive urine culture result in the records [facility].  I will like her to have a catheterized specimen sent for UA and culture.

## 2016-02-27 ENCOUNTER — Telehealth: Payer: Self-pay | Admitting: Urology

## 2016-02-27 DIAGNOSIS — N390 Urinary tract infection, site not specified: Secondary | ICD-10-CM

## 2016-02-27 NOTE — Telephone Encounter (Signed)
Patient was not found to have any documented urinary tract infections on the records provided to us by her other providers. Her urine culture with us was negative. I would like her to undergo a CT urogram and cystoscopy.  She will need a BUN + creatinine and serum pregnancy test prior to the study.

## 2016-02-27 NOTE — Telephone Encounter (Signed)
No answer

## 2016-03-02 ENCOUNTER — Other Ambulatory Visit: Payer: Self-pay

## 2016-03-02 DIAGNOSIS — R310 Gross hematuria: Secondary | ICD-10-CM

## 2016-03-02 NOTE — Telephone Encounter (Signed)
Spoke with pt in reference to UTI, CT, cysto, and lab work. Pt stated that she has an appt tomorrow and can set up the appt then. Orders placed.

## 2016-03-03 ENCOUNTER — Encounter: Payer: Self-pay | Admitting: Urology

## 2016-03-03 ENCOUNTER — Ambulatory Visit (INDEPENDENT_AMBULATORY_CARE_PROVIDER_SITE_OTHER): Payer: Medicaid Other | Admitting: Urology

## 2016-03-03 VITALS — BP 120/81 | HR 99 | Ht 66.0 in | Wt 259.9 lb

## 2016-03-03 DIAGNOSIS — R300 Dysuria: Secondary | ICD-10-CM | POA: Diagnosis not present

## 2016-03-03 DIAGNOSIS — R310 Gross hematuria: Secondary | ICD-10-CM

## 2016-03-03 DIAGNOSIS — N390 Urinary tract infection, site not specified: Secondary | ICD-10-CM | POA: Diagnosis not present

## 2016-03-03 LAB — URINALYSIS, COMPLETE
BILIRUBIN UA: NEGATIVE
GLUCOSE, UA: NEGATIVE
KETONES UA: NEGATIVE
LEUKOCYTES UA: NEGATIVE
Nitrite, UA: NEGATIVE
PROTEIN UA: NEGATIVE
Specific Gravity, UA: 1.02 (ref 1.005–1.030)
Urobilinogen, Ur: 0.2 mg/dL (ref 0.2–1.0)
pH, UA: 7 (ref 5.0–7.5)

## 2016-03-03 LAB — MICROSCOPIC EXAMINATION: RBC, UA: NONE SEEN /hpf (ref 0–?)

## 2016-03-03 MED ORDER — URIBEL 118 MG PO CAPS: 1.0000 | ORAL_CAPSULE | Freq: Four times a day (QID) | ORAL | Status: DC

## 2016-03-03 NOTE — Progress Notes (Signed)
In and Out Catheterization  Patient is present today for a I & O catheterization due to needing a clean catch specimen. Patient was cleaned and prepped in a sterile fashion with betadine and Lidocaine 2% jelly was instilled into the urethra.  A 14FR cath was inserted no complications were noted , 30ml of urine return was noted, urine was clear and yellow  in color. A clean urine sample was collected for clean catch urinalysis and culture. Bladder was drained and catheter was removed with out difficulty.    Preformed by: Dallas Schimkeamona Arleene Settle CMA

## 2016-03-03 NOTE — Progress Notes (Signed)
5:00 PM   Tracy Petersen Nov 15, 1990 [id number]  Referring provider: [facility] [address] [place], [place] [address]  Chief Complaint  Patient presents with  . Recurrent UTI    follow up for pending urine cultures    HPI: Patient is a 25 year old Caucasian female who presents today for catheterization for UA and urine culture.  Background history Patient was referred by her primary care provider, Dr. Elyse Hsu, for recurrent urinary tract infections.  She states that for the last 5 years she has been daily with frequent UT'Is and yeast infections.  Her symptoms at the time of infection are dysuria, frequency, pain and her sides, lower back ache and nausea. She states that she had a cystoscopy 2011 with [facility]rological was not found to have any abnormalities. She was then placed on a small dose of Bactrim daily. When she returns to see her primary care provider few months ago, she was taken off the Bactrim. She states that while she was on the suppressive antibiotic she did not experience urinary tract infections, but now that she is off the suppressive antibiotics for UTI's have returned.  Review of her records from other providers have not demonstrated any documented urinary tract infection. Her culture from her last visit was negative for infection.  As were her GC and chlamydia cultures.  She was recently seen at [facility] for symptoms of a urinary tract infection,  But urine culture was again negative.  Her UA on 02/20/2016 demonstrated 3-10 rbc's per high-power field.  She has not had a formal hematuria workup.   She states her baseline urinary symptoms consist of urinary frequency, stress and urge incontinence. She states that she has also had episodes of gross hematuria. She does not have a history of nephrolithiasis. She is adopted, so she is unaware of her family history. She alternates between diarrhea and constipation. She is drinking 2-3 L of bottles of water  daily.    I do not have a documented urinary tract infection at this time and requested records did not demonstrate any positive urine cultures.    She does not have a prior history of nephrolithiasis, trauma to the genitourinary tract or malignancies of the genitourinary tract.   Today, she is having symptoms of frequent urination, dysuria and incontinence.  She is currently on Bactrim as prescribed to her [facility].  She not experiencing any suprapubic pain, abdominal pain or flank pain. She denies any recent fevers, chills, nausea or vomiting.   She underwent a CT renal stone study on 11/14/2015 and it was unremarkable.  She is not a smoker.   She is not exposed to secondhand smoke.  She has not worked with Personnel officer.    PMH: Past Medical History  Diagnosis Date  . Borderline personality disorder   . Bipolar 1 disorder (HCC)   . Acid reflux   . Hypothyroidism   . Depression with anxiety   . Asthma   . Fracture     fracture to rt. knee   . Recurrent UTI   . Dysuria   . Knee pain, right     likely patellofemoral syndrome  . Tachycardia   . GERD (gastroesophageal reflux disease)   . Vitamin B 12 deficiency   . Shingles   . Chronic cough   . Drug therapy     Lithium  . Obesity   . Prediabetes   . Microscopic hematuria   . IBS (irritable bowel syndrome)   .  Gastritis   . Pyloric stenosis   . Constipation   . AR (allergic rhinitis)   . Contraceptive management     Surgical History: Past Surgical History  Procedure Laterality Date  . Cholecystectomy  06/11/09    chronic cholecystitis  . Surgery as a baby      few months old, ?pyloric stenosis  . Liver biopsy  06/11/09    [place]-No cirrhosis or bridging fibrosis, minimal portal lymphocytic inflammation & mild microvesicular steatosis  . Colonoscopy with propofol  08/30/2012    [facility]:Small internal hemorrhoids/Tortuous redundant sigmoid and transverse colon  . Esophagogastroduodenoscopy (egd) with propofol   08/30/2012    Procedure: ESOPHAGOGASTRODUODENOSCOPY (EGD) WITH PROPOFOL;  Surgeon: West BaliSandi L Fields, MD;  Location: [facility];  Service: Endoscopy;  Laterality: N/A;  start at 12:11  . Biopsy  08/30/2012    Procedure: BIOPSY;  Surgeon: West BaliSandi L Fields, MD;  Location: [facility];  Service: Endoscopy;  Laterality: N/A;  Random colon biopsies; Duodenal biopsies; Gastric Biopsies    Home Medications:    Medication List       This list is accurate as of: 03/03/16  5:00 PM.  Always use your most recent med list.               acidophilus Caps capsule  Take 1 capsule by mouth daily. Reported on 02/20/2016     albuterol 108 (90 Base) MCG/ACT inhaler  Commonly known as:  PROVENTIL HFA;VENTOLIN HFA  Inhale 1-2 puffs into the lungs every 6 (six) hours as needed for wheezing.     baclofen 10 MG tablet  Commonly known as:  LIORESAL  Take 1 tablet (10 mg total) by mouth 3 (three) times daily.     cephALEXin 500 MG capsule  Commonly known as:  KEFLEX  Take 1 capsule (500 mg total) by mouth 2 (two) times daily.     clonazePAM 0.5 MG tablet  Commonly known as:  KLONOPIN  Take 0.5-1 mg by mouth at bedtime. Takes 0.5 mg (1 tab) orally in the morning and 2 tablets (1mg ) orally once a day at bedtime.     FANAPT 4 MG Tabs tablet  Generic drug:  iloperidone  Take 2 mg by mouth at bedtime. Reported on 03/03/2016     FLUoxetine 40 MG capsule  Commonly known as:  PROZAC  Take 40 mg by mouth daily.     gabapentin 300 MG capsule  Commonly known as:  NEURONTIN  Take 300 mg by mouth at bedtime.     HYDROcodone-ibuprofen 7.5-200 MG tablet  Commonly known as:  VICOPROFEN  Take 1 tablet by mouth every 8 (eight) hours as needed. Reported on 03/03/2016     levothyroxine 75 MCG tablet  Commonly known as:  SYNTHROID, LEVOTHROID  Take 75 mcg by mouth daily before breakfast.     LINZESS 290 MCG Caps capsule  Generic drug:  linaclotide  Take 290 mcg by mouth daily. Reported on 03/03/2016     lithium carbonate 300 MG  capsule  Take 900 mg by mouth at bedtime.     meloxicam 15 MG tablet  Commonly known as:  MOBIC  Take 1 tablet (15 mg total) by mouth daily.     ondansetron 4 MG disintegrating tablet  Commonly known as:  ZOFRAN ODT  Take 1 tablet (4 mg total) by mouth every 8 (eight) hours as needed for nausea or vomiting.     pantoprazole 40 MG tablet  Commonly known as:  PROTONIX  Take 1 tablet (  40 mg total) by mouth daily.     promethazine 25 MG tablet  Commonly known as:  PHENERGAN  Take 25 mg by mouth every 6 (six) hours as needed. Reported on 02/20/2016     ranitidine 150 MG tablet  Commonly known as:  ZANTAC  Take 150 mg by mouth daily.     traMADol 50 MG tablet  Commonly known as:  ULTRAM  Take 1 tablet (50 mg total) by mouth every 6 (six) hours as needed.     traMADol 50 MG tablet  Commonly known as:  ULTRAM  Take 1 tablet (50 mg total) by mouth every 6 (six) hours as needed for moderate pain.     traZODone 50 MG tablet  Commonly known as:  DESYREL  Take 25 mg by mouth at bedtime.     TRINESSA (28) PO  Take 1 tablet by mouth daily.     VITAMIN B-12 PO  Take 1 tablet by mouth daily. Reported on 03/03/2016        Allergies:  Allergies  Allergen Reactions  . Amoxicillin Other (See Comments)    Unknown reaction not indicated on mar.  . Penicillins Itching and Rash    Red rash and itching. Has patient had a PCN reaction causing immediate rash, facial/tongue/throat swelling, SOB or lightheadedness with hypotension: Yes Has patient had a PCN reaction causing severe rash involving mucus membranes or skin necrosis: No Has patient had a PCN reaction that required hospitalization  not sure  Has patient had a PCN reaction occurring within the last 10 years:  not sure If all of the above answers are "NO", then may proceed with Cephalospo    Family History: Family History  Problem Relation Age of Onset  . Adopted: Yes  . Colon cancer Neg Hx   . Kidney disease Neg Hx   . Bladder  Cancer Neg Hx     Social History:  reports that she has never smoked. She has never used smokeless tobacco. She reports that she does not drink alcohol or use illicit drugs.  ROS: UROLOGY Frequent Urination?: Yes Hard to postpone urination?: No Burning/pain with urination?: Yes Get up at night to urinate?: No Leakage of urine?: Yes Urine stream starts and stops?: No Trouble starting stream?: No Do you have to strain to urinate?: No Blood in urine?: Yes Urinary tract infection?: Yes Sexually transmitted disease?: No Injury to kidneys or bladder?: No Painful intercourse?: No Weak stream?: No Currently pregnant?: No Vaginal bleeding?: No Last menstrual period?: n  Gastrointestinal Nausea?: Yes Vomiting?: No Indigestion/heartburn?: No Diarrhea?: No Constipation?: No  Constitutional Fever: No Night sweats?: No Weight loss?: No Fatigue?: Yes  Skin Skin rash/lesions?: No Itching?: No  Eyes Blurred vision?: No Double vision?: No  Ears/Nose/Throat Sore throat?: No Sinus problems?: No  Hematologic/Lymphatic Swollen glands?: No Easy bruising?: No  Cardiovascular Leg swelling?: No Chest pain?: Yes  Respiratory Cough?: No Shortness of breath?: No  Endocrine Excessive thirst?: Yes  Musculoskeletal Back pain?: Yes Joint pain?: Yes  Neurological Headaches?: Yes Dizziness?: Yes  Psychologic Depression?: No Anxiety?: No  Physical Exam: BP 120/81 mmHg  Pulse 99  Ht  (1.676 m)  Wt 259 lb 14.4 oz (117.89 kg)  BMI 41.97 kg/m2  LMP 02/20/2016  Constitutional: Well nourished. Alert and oriented, No acute distress. HEENT: [place] AT, moist mucus membranes. Trachea midline, no masses. Cardiovascular: No clubbing, cyanosis, or edema. Respiratory: Normal respiratory effort, no increased work of breathing. GI: Abdomen is soft, non tender, non  distended, no abdominal masses. Liver and spleen not palpable.  No hernias appreciated.  Stool sample for occult  testing is not indicated.   GU: No CVA tenderness.  No bladder fullness or masses.   Skin: No rashes, bruises or suspicious lesions. Lymph: No cervical or inguinal adenopathy. Neurologic: Grossly intact, no focal deficits, moving all 4 extremities. Psychiatric: Normal mood and affect.  Laboratory Data: Lab Results  Component Value Date   WBC 12.1* 01/02/2016   HGB 12.5 01/02/2016   HCT 38.4 01/02/2016   MCV 86.6 01/02/2016   PLT 309 01/02/2016    Lab Results  Component Value Date   CREATININE 0.88 01/02/2016     Lab Results  Component Value Date   TSH 6.030* 08/11/2012    Lab Results  Component Value Date   AST 14* 11/14/2015   Lab Results  Component Value Date   ALT 18 11/14/2015     Urinalysis Results for orders placed or performed in visit on 03/03/16  Microscopic Examination  Result Value Ref Range   WBC, UA 0-5 0 -  5 /hpf   RBC, UA None seen 0 -  2 /hpf   Epithelial Cells (non renal) 0-10 0 - 10 /hpf   Mucus, UA Present (A) Not Estab.   Bacteria, UA Few (A) None seen/Few  Urinalysis, Complete  Result Value Ref Range   Specific Gravity, UA 1.020 1.005 - 1.030   pH, UA 7.0 5.0 - 7.5   Color, UA Yellow Yellow   Appearance Ur Clear Clear   Leukocytes, UA Negative Negative   Protein, UA Negative Negative/Trace   Glucose, UA Negative Negative   Ketones, UA Negative Negative   RBC, UA Trace (A) Negative   Bilirubin, UA Negative Negative   Urobilinogen, Ur 0.2 0.2 - 1.0 mg/dL   Nitrite, UA Negative Negative   Microscopic Examination See below:     Pertinent Imaging: CLINICAL DATA: Acute onset of bilateral flank pain and vaginal itching. Dark brown discharge. Intermittent nausea and loss of appetite. Initial encounter.  EXAM: CT ABDOMEN AND PELVIS WITHOUT CONTRAST  TECHNIQUE: Multidetector CT imaging of the abdomen and pelvis was performed following the standard protocol without IV contrast.  COMPARISON: CT of the abdomen and pelvis from  06/21/2012  FINDINGS: The visualized lung bases are clear.  The liver and spleen are unremarkable in appearance. The patient is status post cholecystectomy, with clips noted at the gallbladder fossa. The pancreas and adrenal glands are unremarkable.  The kidneys are unremarkable in appearance. There is no evidence of hydronephrosis. No renal or ureteral stones are seen. No perinephric stranding is appreciated.  No free fluid is identified. The small bowel is unremarkable in appearance. The stomach is within normal limits. No acute vascular abnormalities are seen. A circumaortic left renal vein is noted.  The appendix is normal in caliber, without evidence of appendicitis. The colon is unremarkable in appearance.  The bladder is mildly distended and grossly unremarkable. The uterus is unremarkable in appearance. The ovaries are relatively symmetric. No suspicious adnexal masses are seen. No inguinal lymphadenopathy is seen.  No acute osseous abnormalities are identified.  IMPRESSION: Unremarkable noncontrast CT of the abdomen and pelvis.   Electronically Signed  By: Roanna Raider M.D.  On: 11/15/2015 00:12  Results for NAKIRA, LITZAU (MRN [id number]) as of 02/25/2016 23:36  Ref. Range 02/20/2016 16:15  Scan Result Unknown 79    Assessment & Plan:    1. Recurrent UTI:   I do not have any  documented urinary tract infections available to me at this time. Review of her CT records did not demonstrate positive urine cultures.  She was CATH for a specimen today.  UA is unremarkable.  We will send today's urine for culture. Patient has a CT urogram pending.  - Urinalysis, Complete - CULTURE, URINE COMPREHENSIVE  2. Gross hematuria:   Explained to patient the causes of blood in the urine are as follows: stones, UTI's, damage to the urinary tract and/or cancer.    It is explained to the patient that they will be scheduled for a CT Urogram with contrast material and  that in rare instances, an allergic reaction can be serious and even life threatening with the injection of contrast material.   The patient denies any allergies to contrast, iodine and/or seafood and is not taking metformin.  3. Dysuria:   CT urogram is pending at this time.  Her cultures have been negative.  She is given Uribel in the interim for the discomfort.   Return for CT Urogram report.  These notes generated with voice recognition software. I apologize for typographical errors.  Michiel Cowboy, PA-C  [facility] [address], [address] [place], [place] [address] [phone]

## 2016-03-04 ENCOUNTER — Telehealth: Payer: Self-pay | Admitting: Urology

## 2016-03-04 LAB — BUN+CREAT
BUN/Creatinine Ratio: 8 — ABNORMAL LOW (ref 9–23)
BUN: 7 mg/dL (ref 6–20)
Creatinine, Ser: 0.93 mg/dL (ref 0.57–1.00)
GFR, EST AFRICAN AMERICAN: 97 mL/min/{1.73_m2} (ref >59)
GFR, EST NON AFRICAN AMERICAN: 84 mL/min/{1.73_m2} (ref >59)

## 2016-03-04 NOTE — Telephone Encounter (Signed)
Ms. Tracy Petersen called saying her pharmacy told her that her insurance carrier won't cover the cost of Uribel and they'd have to charge her $150 to fill it. She's wondering if a different Rx can be prescribed for her. She'd like a phone call regarding this.  Pt's ph# [phone] Thank you.

## 2016-03-05 ENCOUNTER — Other Ambulatory Visit: Payer: Self-pay

## 2016-03-05 LAB — CULTURE, URINE COMPREHENSIVE

## 2016-03-06 ENCOUNTER — Telehealth: Payer: Self-pay

## 2016-03-06 NOTE — Telephone Encounter (Signed)
Ramona spoke with pt and made aware of -ucx.

## 2016-03-06 NOTE — Telephone Encounter (Signed)
Unfortunately, these types of medications are usually not covered by insurance companies and we have limited samples.  I suggest that she use OTC medications, like Cystex, until we get the CT Urogram and cystoscopy appointment.

## 2016-03-06 NOTE — Telephone Encounter (Signed)
Spoke with patient in regards to medication. Offered her the coupon and let her know [facility] Patient stated that is still too much for her to pay. I let her know that almost all of the meds would be at least that or more but I would let Carollee HerterShannon know to see what else we can do.

## 2016-03-06 NOTE — Telephone Encounter (Signed)
-----   Message from Harle BattiestShannon A McGowan, PA-C sent at 03/05/2016  6:50 PM EDT ----- Please notify the patient that her urine culture was negative.  Proceed with CT Urogram and cystoscopy.

## 2016-03-06 NOTE — Telephone Encounter (Signed)
Spoke with pt in reference to uribel and cystex plus. Pt wanted to know how much cystex plus was going to cost. Made pt aware [facility] medication ranged from $8-$20. Pt stated that cost to much. Pt then stated that if we are not able to give her any medication to help with the symptoms then she was going to urgent care to get an abx. Reinforced with pt her ucx was completely negative and had no growth for 36-48 hrs. Pt voiced understanding and stated that she needed something to help with her symptoms. Reinforced with pt cystex plus was OTC or she could have a rx for Uribel and use the coupon. Pt stated that she was not happy with those answers but would see what she could do. Pt then stated she would call back Monday.

## 2016-03-08 DIAGNOSIS — R300 Dysuria: Secondary | ICD-10-CM | POA: Insufficient documentation

## 2016-03-16 ENCOUNTER — Ambulatory Visit
Admission: RE | Admit: 2016-03-16 | Discharge: 2016-03-16 | Disposition: A | Discharge status: Home / Self Care | Payer: Medicaid Other | Source: Ambulatory Visit | Attending: Urology | Admitting: Urology

## 2016-03-16 DIAGNOSIS — R310 Gross hematuria: Secondary | ICD-10-CM | POA: Diagnosis present

## 2016-03-16 MED ORDER — IOPAMIDOL (ISOVUE-300) INJECTION 61%
150.0000 mL | Freq: Once | INTRAVENOUS | Status: AC | PRN
  Administered 2016-03-16: 150 mL via INTRAVENOUS

## 2016-03-26 ENCOUNTER — Ambulatory Visit (INDEPENDENT_AMBULATORY_CARE_PROVIDER_SITE_OTHER): Payer: Medicaid Other | Admitting: Urology

## 2016-03-26 ENCOUNTER — Encounter: Payer: Self-pay | Admitting: Urology

## 2016-03-26 VITALS — BP 123/82 | HR 108 | Ht 66.0 in | Wt 256.9 lb

## 2016-03-26 DIAGNOSIS — R310 Gross hematuria: Secondary | ICD-10-CM

## 2016-03-26 MED ORDER — LIDOCAINE HCL 2 % EX GEL: 1.0000 "application " | Freq: Once | CUTANEOUS | Status: DC

## 2016-03-26 MED ORDER — CIPROFLOXACIN HCL 500 MG PO TABS: 500.0000 mg | ORAL_TABLET | Freq: Once | ORAL | Status: DC

## 2016-03-27 LAB — MICROSCOPIC EXAMINATION: RBC MICROSCOPIC, UA: NONE SEEN /HPF (ref 0–?)

## 2016-03-27 LAB — URINALYSIS, COMPLETE
Bilirubin, UA: NEGATIVE
Glucose, UA: NEGATIVE
KETONES UA: NEGATIVE
Nitrite, UA: NEGATIVE
PH UA: 7 (ref 5.0–7.5)
Protein, UA: NEGATIVE
RBC, UA: NEGATIVE
SPEC GRAV UA: 1.015 (ref 1.005–1.030)
Urobilinogen, Ur: 0.2 mg/dL (ref 0.2–1.0)

## 2016-03-28 LAB — CULTURE, URINE COMPREHENSIVE

## 2016-04-02 ENCOUNTER — Other Ambulatory Visit: Payer: Medicaid Other

## 2016-04-02 ENCOUNTER — Telehealth: Payer: Self-pay

## 2016-04-02 DIAGNOSIS — N390 Urinary tract infection, site not specified: Secondary | ICD-10-CM

## 2016-04-02 LAB — MICROSCOPIC EXAMINATION

## 2016-04-02 LAB — URINALYSIS, COMPLETE
Bilirubin, UA: NEGATIVE
GLUCOSE, UA: NEGATIVE
Ketones, UA: NEGATIVE
Nitrite, UA: NEGATIVE
PH UA: 7.5 (ref 5.0–7.5)
PROTEIN UA: NEGATIVE
RBC, UA: NEGATIVE
SPEC GRAV UA: 1.01 (ref 1.005–1.030)
Urobilinogen, Ur: 0.2 mg/dL (ref 0.2–1.0)

## 2016-04-02 NOTE — Telephone Encounter (Signed)
Pt called stating that she is having lower abd pain, nausea, hot flashes, foul smelling urine, dark colored, dysuria, and body aches. Offered pt an appt to have a cath specimen due to her recurrent UTIs. Pt stated that she was not going to allow any one to cath her but would just like the abx called into her pharmacy. Reinforced with pt an abx can not be given without a urine specimen and ucx. Pt voiced frustration stating she just needs some help. Again offered pt a lab visit to do a clean catch specimen. After several minutes pt agreed to come in for a clean catch specimen. Pt was added to lab schedule.

## 2016-04-04 LAB — CULTURE, URINE COMPREHENSIVE

## 2016-04-17 ENCOUNTER — Other Ambulatory Visit: Payer: Self-pay

## 2016-05-15 ENCOUNTER — Telehealth: Payer: Self-pay | Admitting: Urology

## 2016-05-15 NOTE — Telephone Encounter (Signed)
She does not want to have this cysto done.  Tracy Petersen

## 2016-06-02 ENCOUNTER — Other Ambulatory Visit: Payer: Self-pay

## 2016-06-18 IMAGING — CR DG ANKLE COMPLETE 3+V*L*
1 series · 3 of 3 positions shown · non-contrast
Comparison: None.

CLINICAL DATA: Pain and swelling after slipping earlier today in
the rain.

EXAM:
LEFT ANKLE COMPLETE - 3+ VIEW

[Series 1: dg ankle complete left · 0.14mm/px · 3 of 3 slices shown]
[im 1/3]
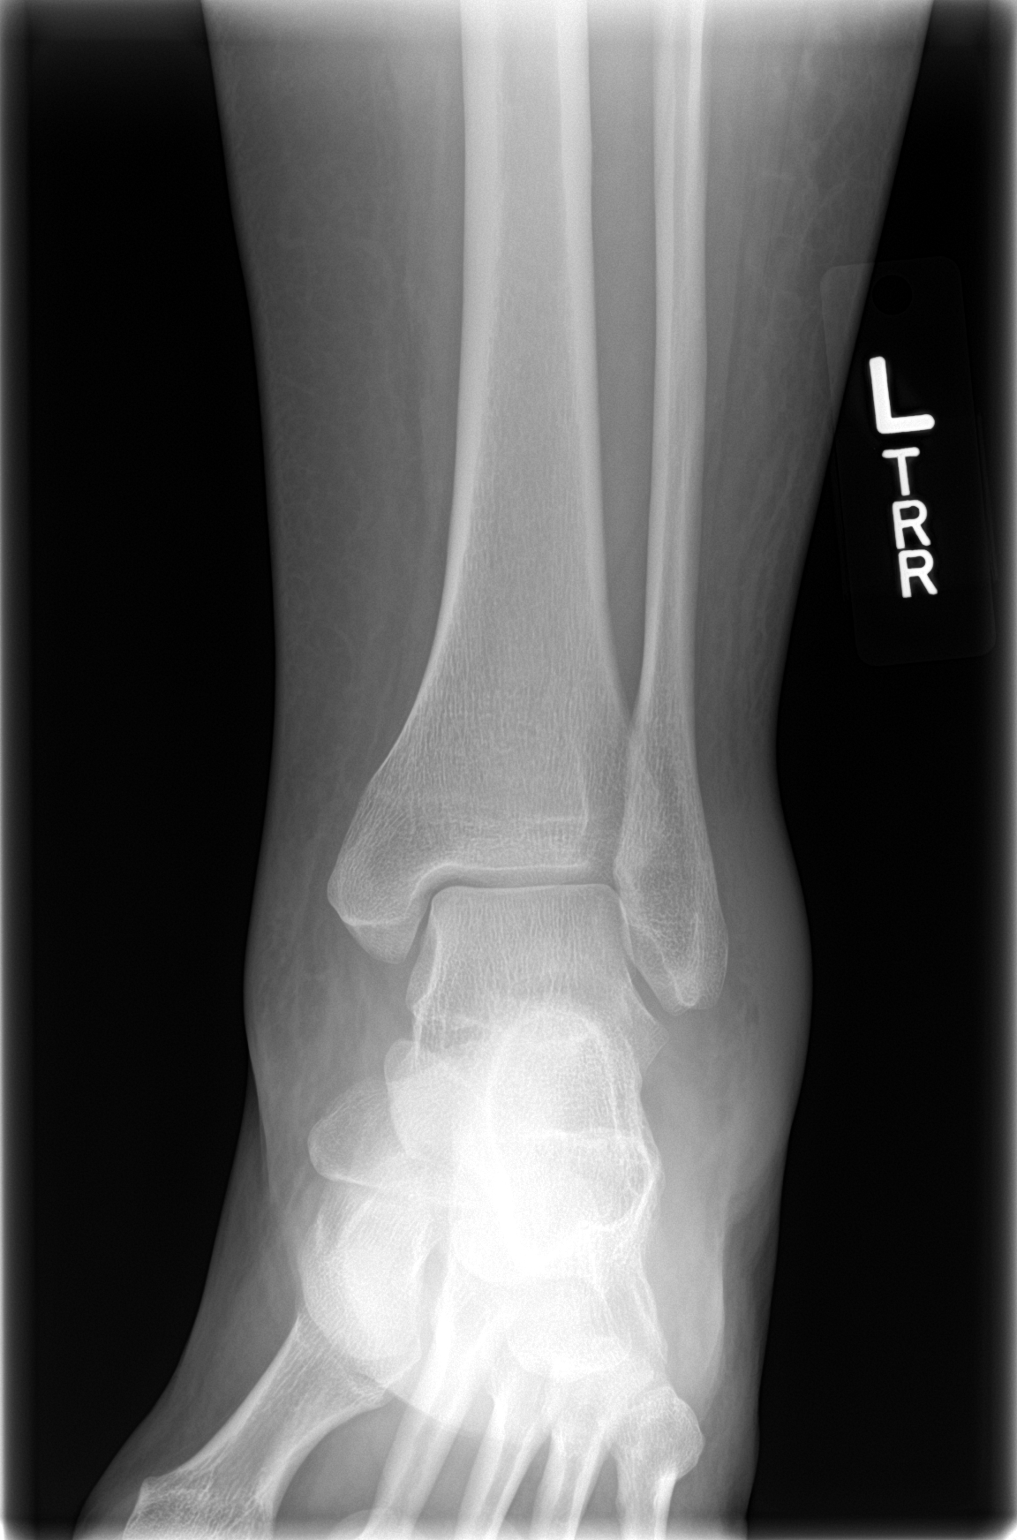
[im 2/3]
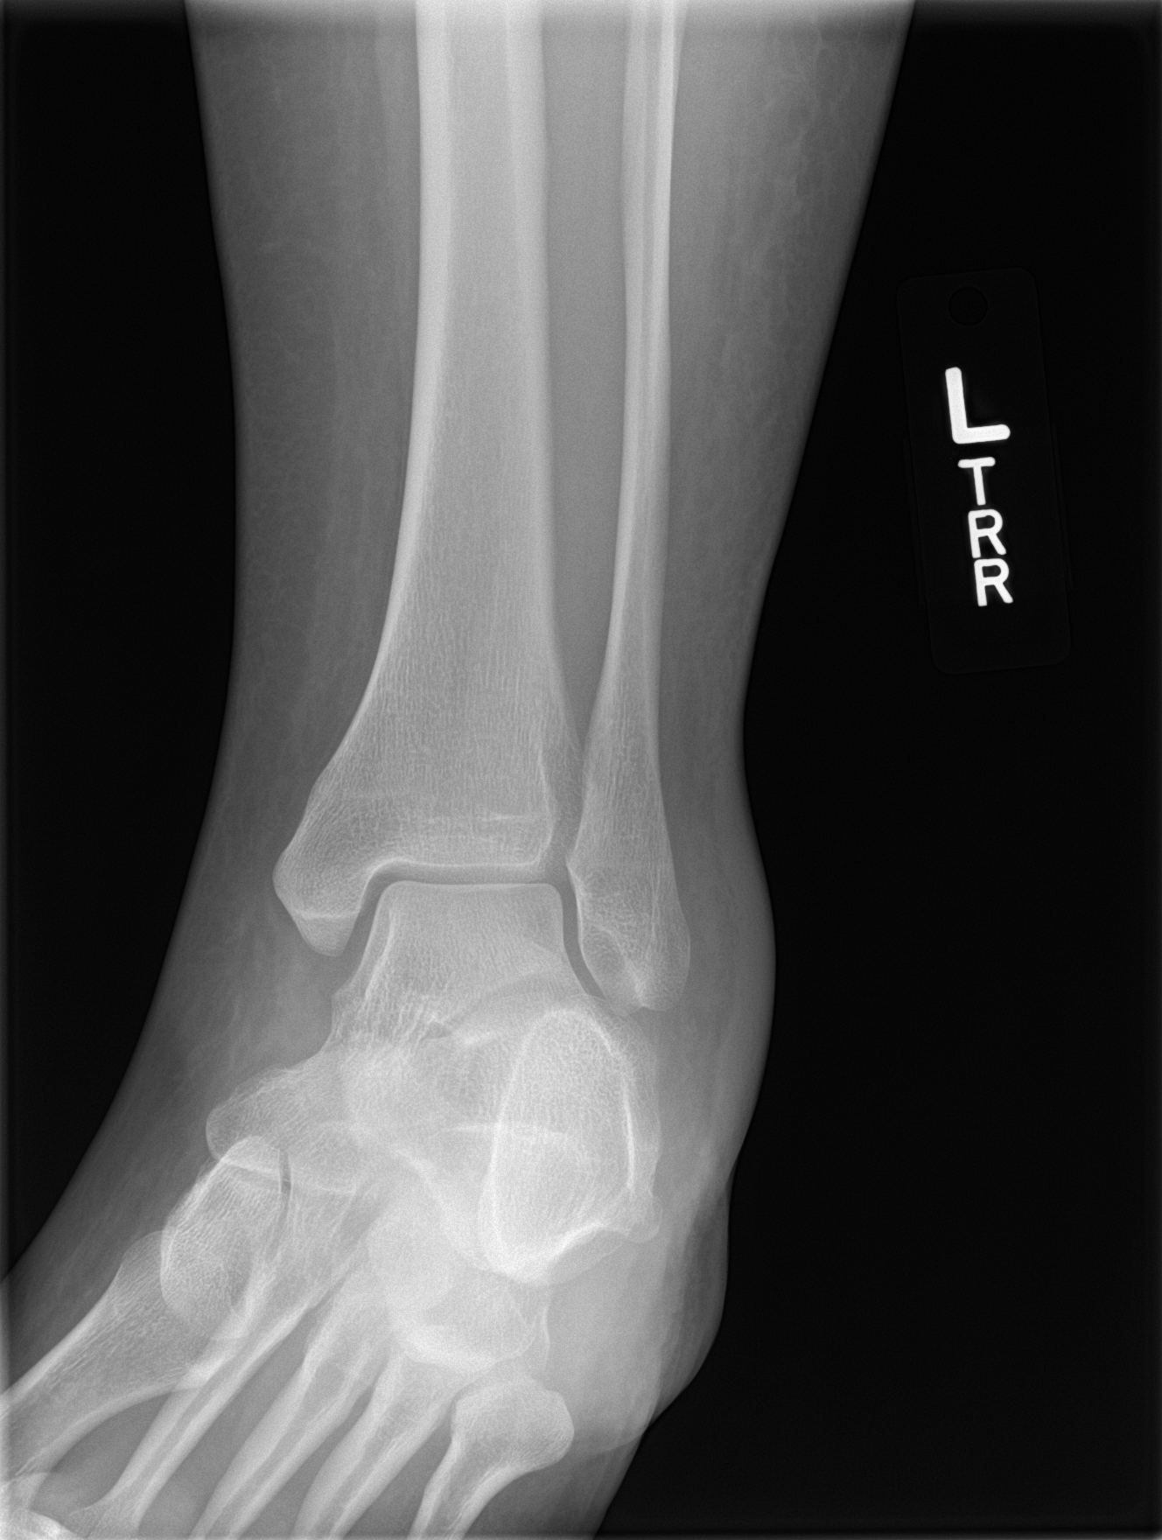
[im 3/3]
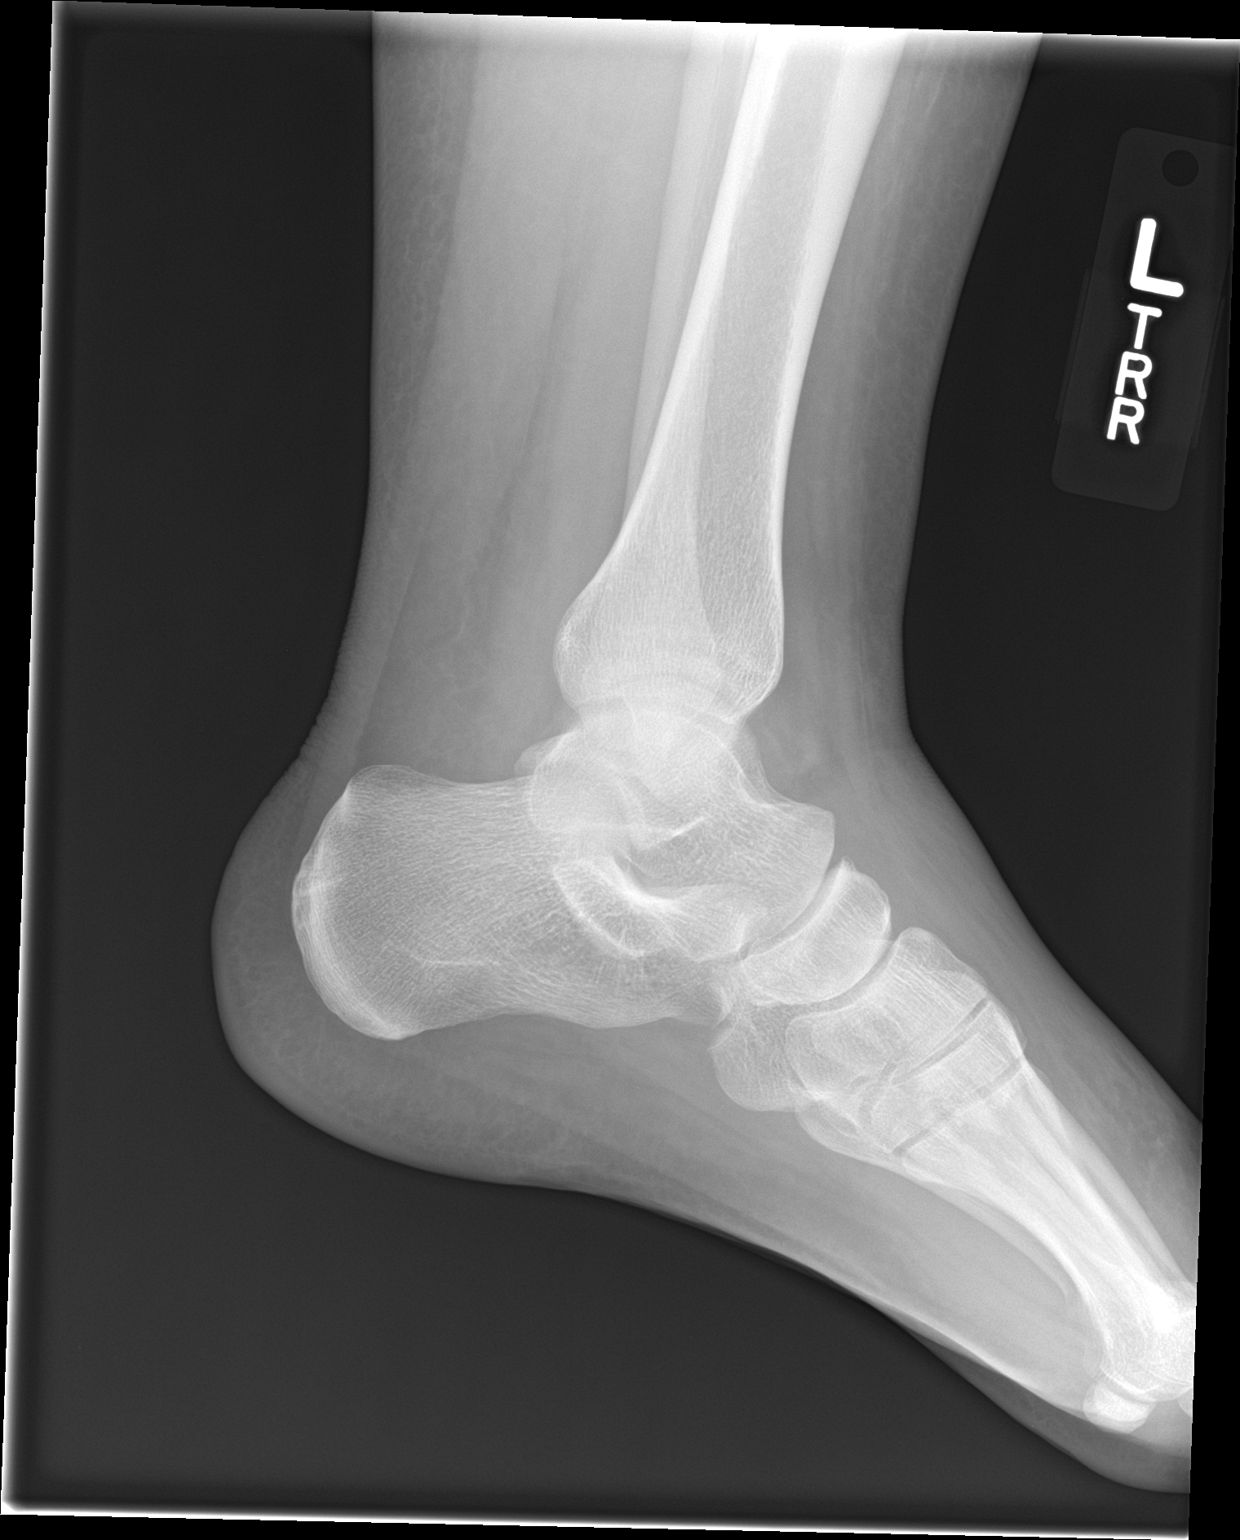

[3 of 3 positions shown; findings below may reference images not displayed]

FINDINGS: There is lateral malleolar soft tissue swelling. No fracture is
evident. The mortise is symmetric. There is no radiopaque foreign
body.
IMPRESSION: Negative for acute fracture
